# Patient Record
Sex: Female | Born: 1965 | Race: White | Hispanic: No | Marital: Single | Smoking: Never smoker
Health system: Southern US, Community
[De-identification: ages and names within clinical notes are randomized; demographics above are authoritative.]

## PROBLEM LIST (undated history)

## (undated) DIAGNOSIS — Z9889 Other specified postprocedural states: Secondary | ICD-10-CM

## (undated) DIAGNOSIS — Z87442 Personal history of urinary calculi: Secondary | ICD-10-CM

## (undated) DIAGNOSIS — T8859XA Other complications of anesthesia, initial encounter: Secondary | ICD-10-CM

## (undated) DIAGNOSIS — J302 Other seasonal allergic rhinitis: Secondary | ICD-10-CM

## (undated) DIAGNOSIS — N6320 Unspecified lump in the left breast, unspecified quadrant: Secondary | ICD-10-CM

## (undated) DIAGNOSIS — K869 Disease of pancreas, unspecified: Secondary | ICD-10-CM

## (undated) DIAGNOSIS — R112 Nausea with vomiting, unspecified: Secondary | ICD-10-CM

## (undated) HISTORY — DX: Other seasonal allergic rhinitis: J30.2

## (undated) HISTORY — PX: ANAL FISSURE REPAIR: SHX2312

## (undated) HISTORY — PX: WISDOM TOOTH EXTRACTION: SHX21

## (undated) HISTORY — PX: ABLATION ON ENDOMETRIOSIS: SHX5787

## (undated) HISTORY — PX: EYE SURGERY: SHX253

---

## 1998-12-01 ENCOUNTER — Ambulatory Visit (HOSPITAL_COMMUNITY): Admission: RE | Admit: 1998-12-01 | Discharge: 1998-12-01 | Payer: Self-pay | Admitting: General Surgery

## 2020-02-24 ENCOUNTER — Other Ambulatory Visit: Payer: Self-pay | Admitting: Obstetrics and Gynecology

## 2020-02-24 DIAGNOSIS — Z1231 Encounter for screening mammogram for malignant neoplasm of breast: Secondary | ICD-10-CM

## 2020-04-14 ENCOUNTER — Ambulatory Visit
Admission: RE | Admit: 2020-04-14 | Discharge: 2020-04-14 | Disposition: A | Discharge status: Home / Self Care | Payer: BC Managed Care – PPO | Source: Ambulatory Visit | Attending: Internal Medicine | Admitting: Internal Medicine

## 2020-04-14 ENCOUNTER — Other Ambulatory Visit: Payer: Self-pay

## 2020-04-14 ENCOUNTER — Other Ambulatory Visit: Payer: Self-pay | Admitting: Internal Medicine

## 2020-04-14 DIAGNOSIS — G44201 Tension-type headache, unspecified, intractable: Secondary | ICD-10-CM

## 2020-04-14 IMAGING — CT CT HEAD W/O CM
3 of 4 series · 14 of 47 positions shown, 16 images · non-contrast
Comparison: None.

CLINICAL DATA: Severe headaches since [DATE].  No known injury.

EXAM:
CT HEAD WITHOUT CONTRAST
TECHNIQUE: Contiguous axial images were obtained from the base of the skull
through the vertex without intravenous contrast.

[Series 2: axial st head 5.00 ax · axial · 0.31mm/px · z∈[-556,-441]mm · 8 of 27 slices shown, 10 images]
[im 2/27  brain]
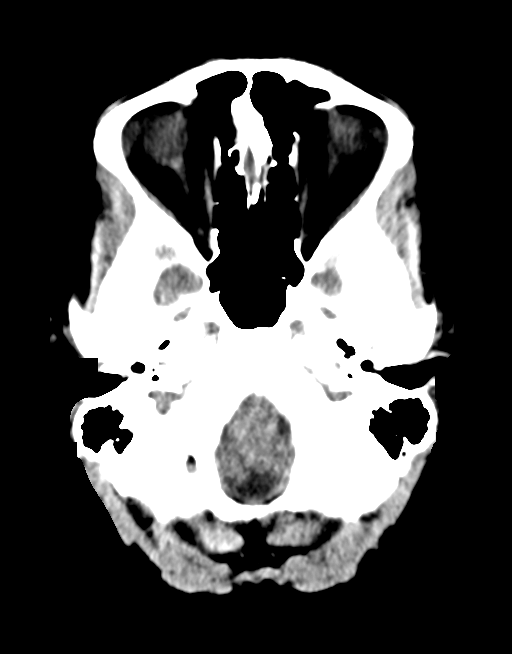
[im 2/27  bone]
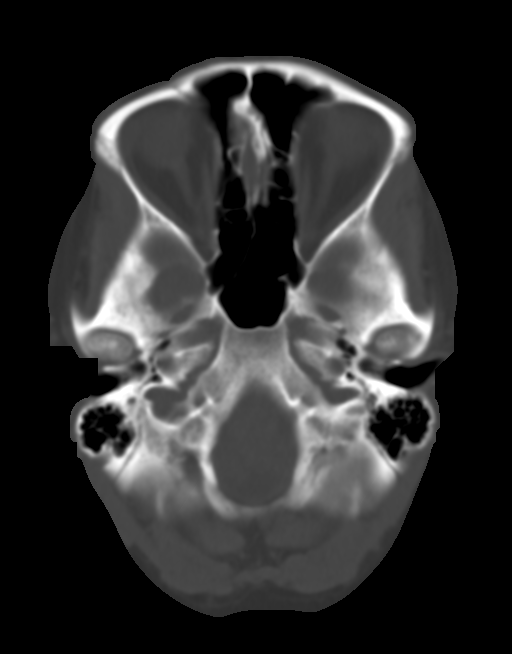
[im 6/27  brain]
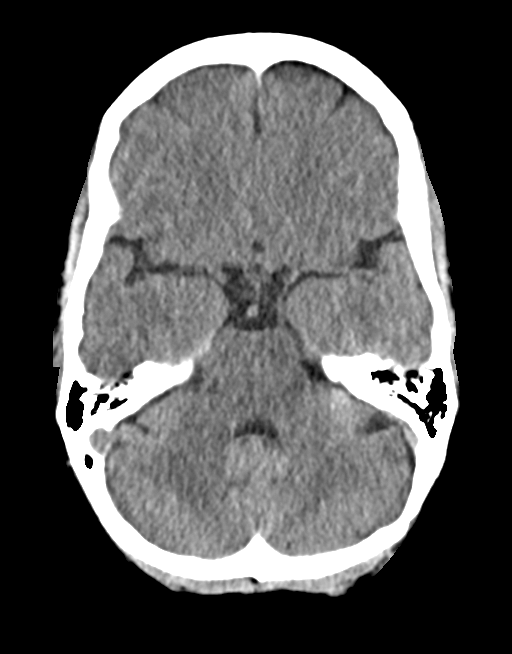
[im 10/27  brain]
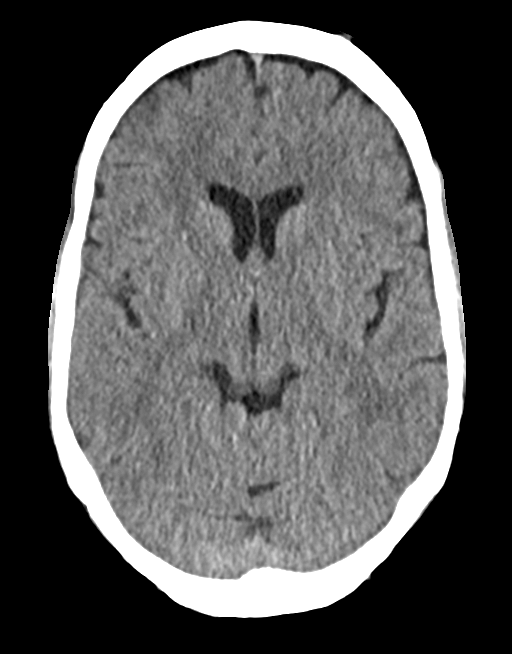
[im 12/27  brain]
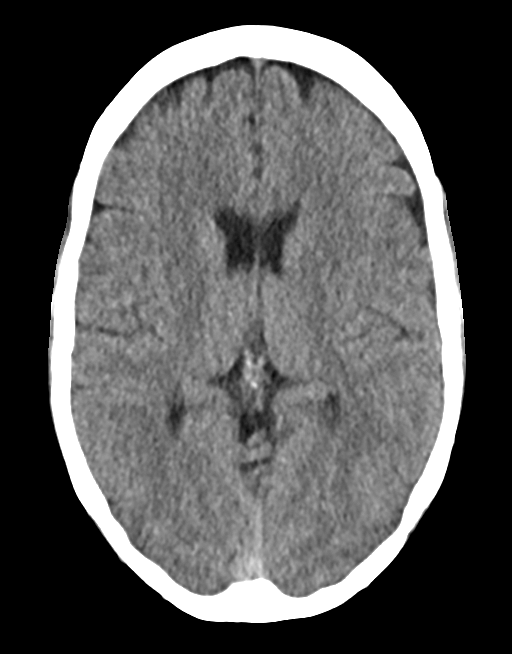
[im 15/27  brain]
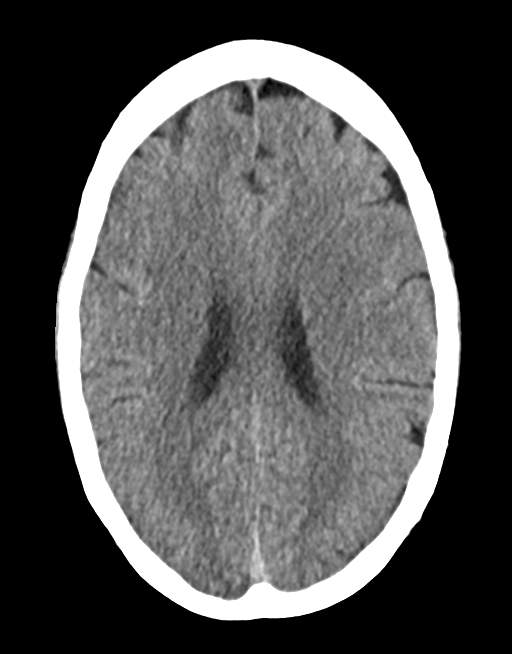
[im 15/27  bone]
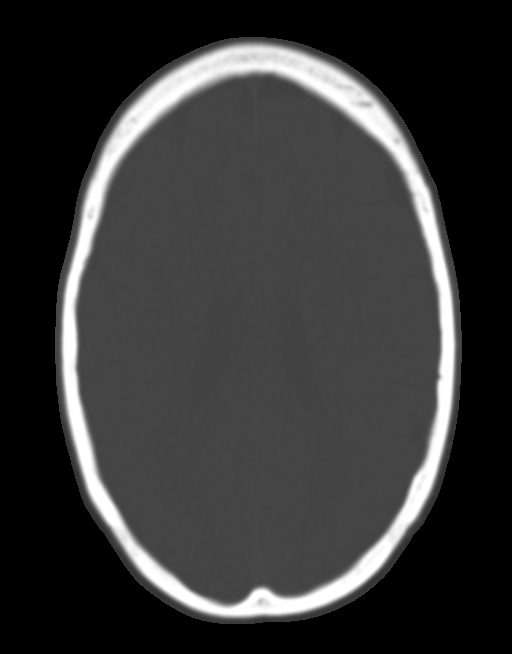
[im 17/27  brain]
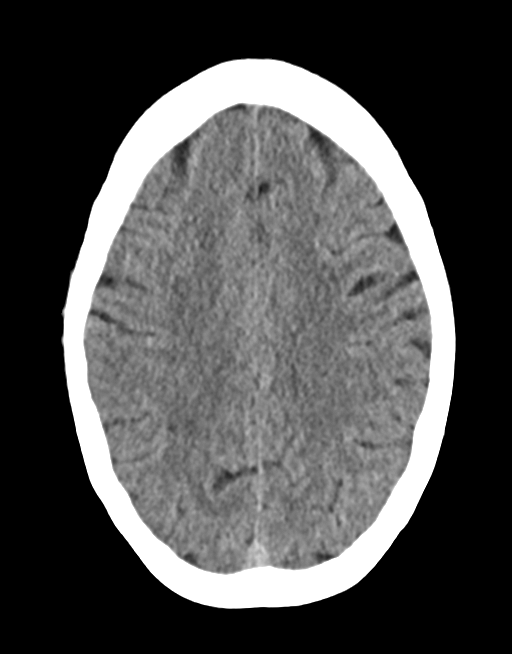
[im 21/27  brain]
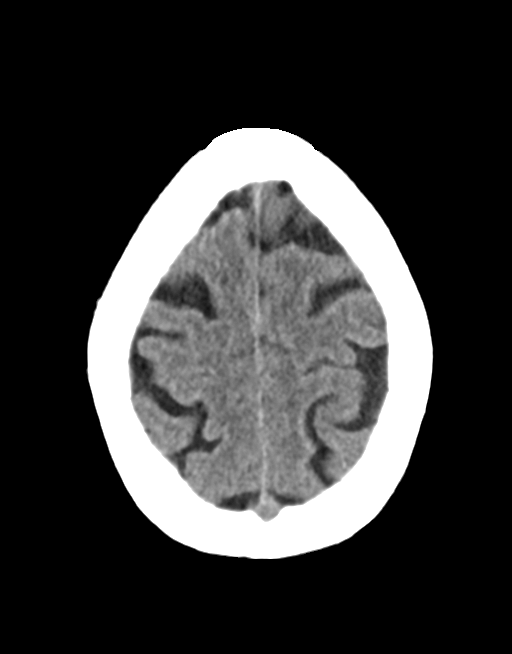
[im 25/27  brain]
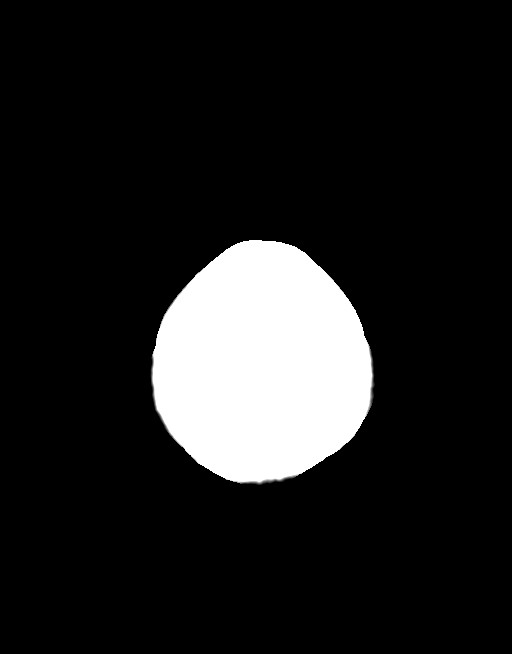

[Series 6: coronals head 3.00 cor · coronal · 0.27mm/px · 3 of 66 slices shown]
[im 22/66  brain]
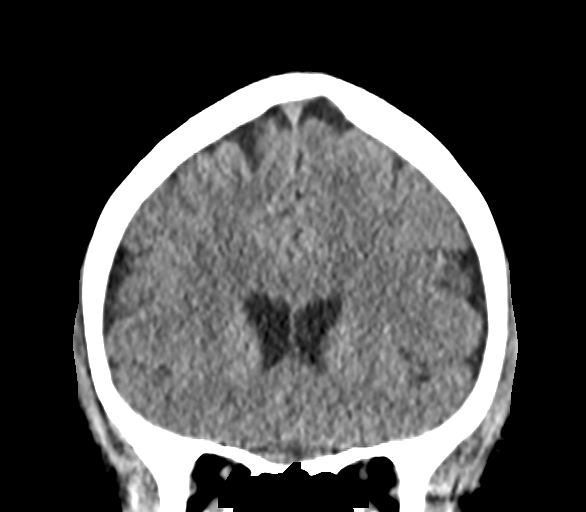
[im 29/66  brain]
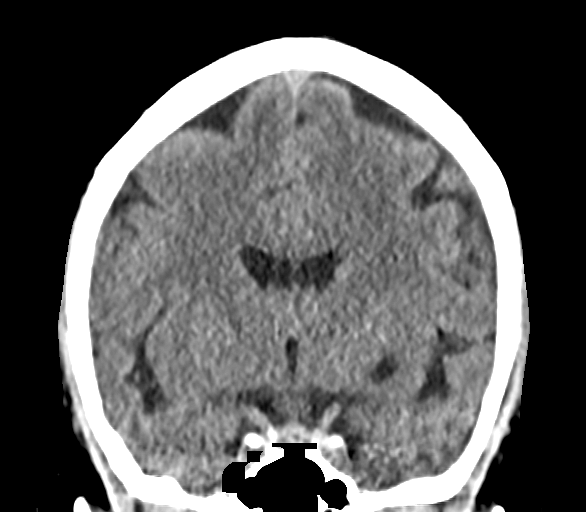
[im 37/66  brain]
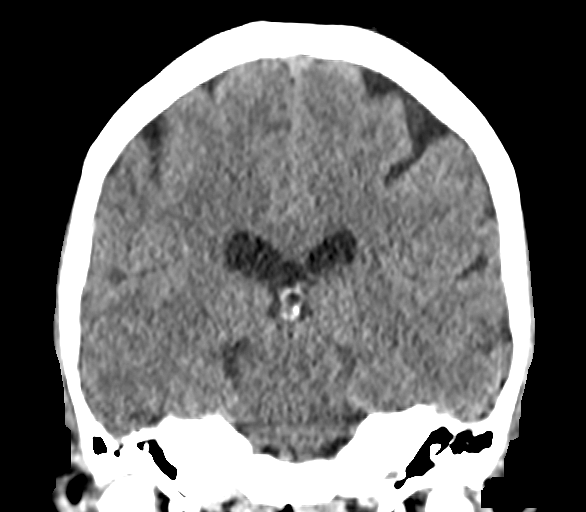

[Series 8: sagittals head 3.00 sag · sagittal · 0.27mm/px · 3 of 52 slices shown]
[im 18/52  brain]
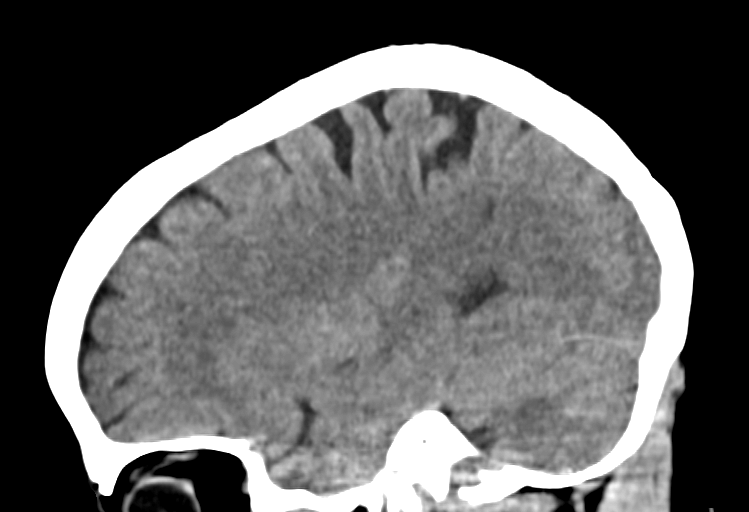
[im 26/52  brain]
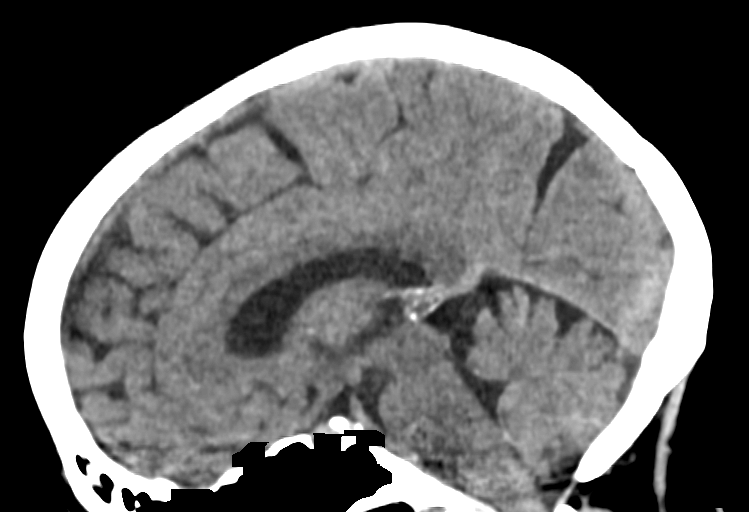
[im 35/52  brain]
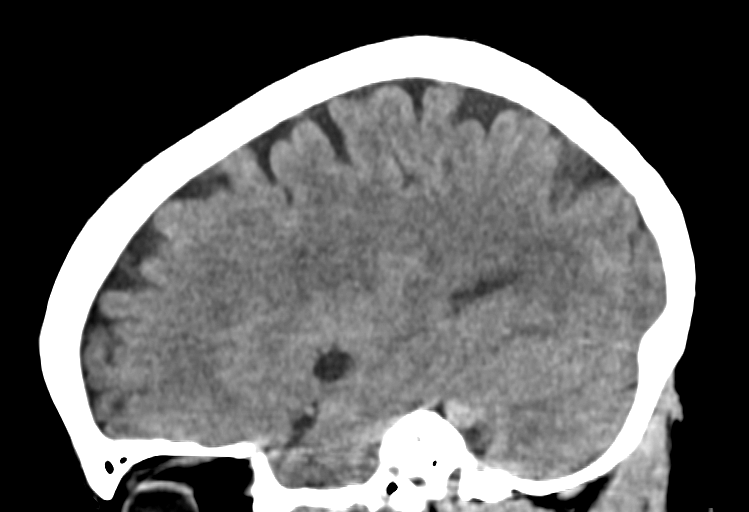

[14 of 47 positions shown; findings below may reference images not displayed]

FINDINGS: Brain: No evidence of acute infarction, hemorrhage, hydrocephalus,
extra-axial collection or mass lesion/mass effect. Dilated
perivascular space near the left basal ganglia noted.

Vascular: No hyperdense vessel or unexpected calcification.

Skull: Intact.  No focal lesion.

Sinuses/Orbits: Normal.

Other: None.
IMPRESSION: Normal head CT.

## 2020-04-20 ENCOUNTER — Other Ambulatory Visit (HOSPITAL_COMMUNITY): Payer: Self-pay | Admitting: Internal Medicine

## 2020-04-20 ENCOUNTER — Other Ambulatory Visit: Payer: Self-pay | Admitting: Internal Medicine

## 2020-04-20 DIAGNOSIS — R109 Unspecified abdominal pain: Secondary | ICD-10-CM

## 2020-05-04 ENCOUNTER — Other Ambulatory Visit: Payer: Self-pay

## 2020-05-04 ENCOUNTER — Ambulatory Visit
Admission: RE | Admit: 2020-05-04 | Discharge: 2020-05-04 | Disposition: A | Discharge status: Home / Self Care | Payer: BC Managed Care – PPO | Source: Ambulatory Visit | Attending: Internal Medicine | Admitting: Internal Medicine

## 2020-05-04 DIAGNOSIS — R109 Unspecified abdominal pain: Secondary | ICD-10-CM | POA: Insufficient documentation

## 2020-05-04 IMAGING — CT CT ABD-PELV W/ CM
2 of 5 series · 16 of 46 positions shown, 18 images · IV contrast (omnipaque)
Comparison: None.

CLINICAL DATA: Patient reports right flank pain for 3 weeks.

EXAM:
CT ABDOMEN AND PELVIS WITH CONTRAST
TECHNIQUE: Multidetector CT imaging of the abdomen and pelvis was performed
using the standard protocol following bolus administration of
intravenous contrast.
CONTRAST:  100mL OMNIPAQUE IOHEXOL 300 MG/ML  SOLN

[Series 2: abd pelvis 5.00 · axial · 0.80mm/px · z∈[-1480,-1090]mm · 13 of 88 slices shown, 15 images]
[im 5/88  soft-tissue]
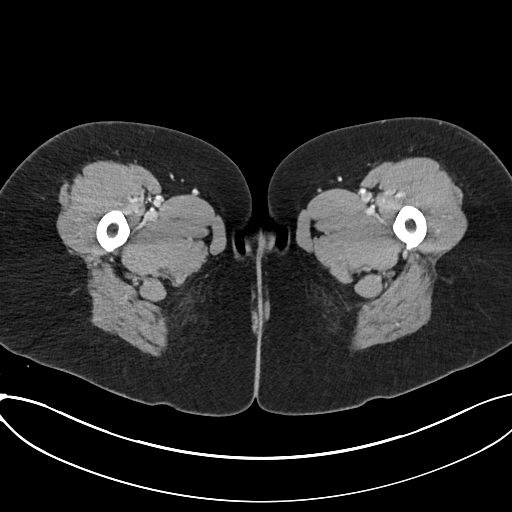
[im 5/88  bone]
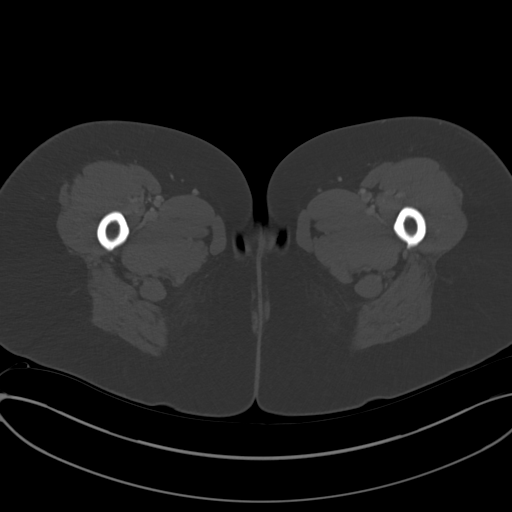
[im 14/88  soft-tissue]
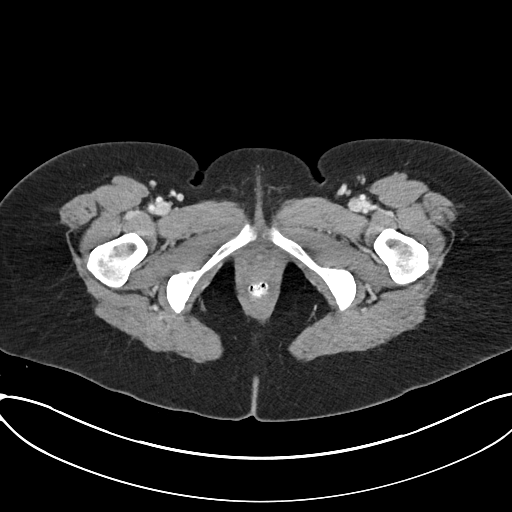
[im 18/88  soft-tissue]
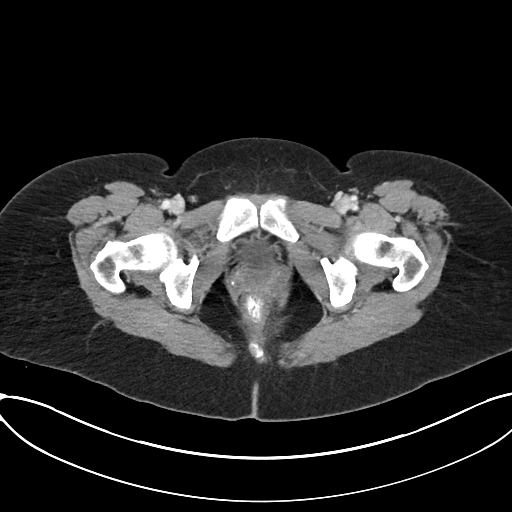
[im 27/88  soft-tissue]
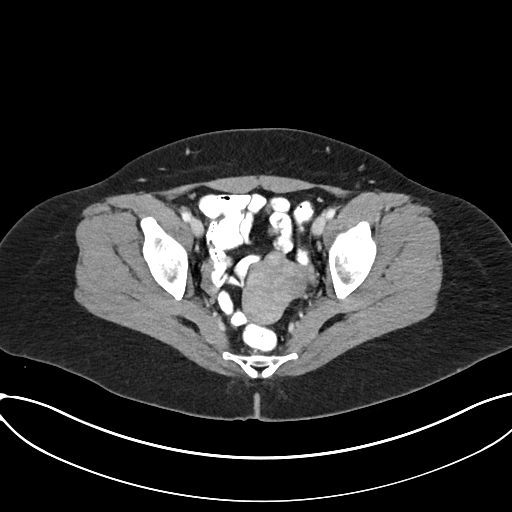
[im 31/88  soft-tissue]
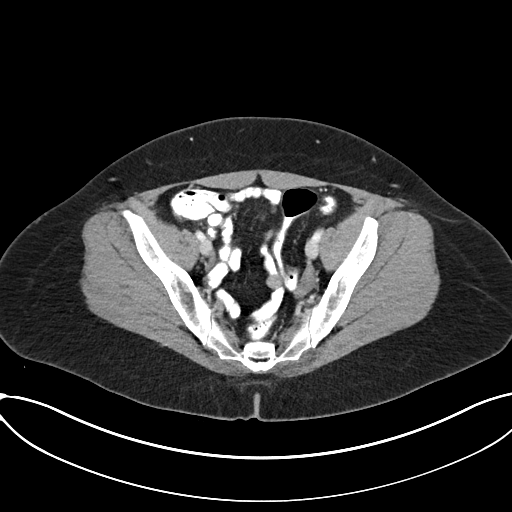
[im 40/88  soft-tissue]
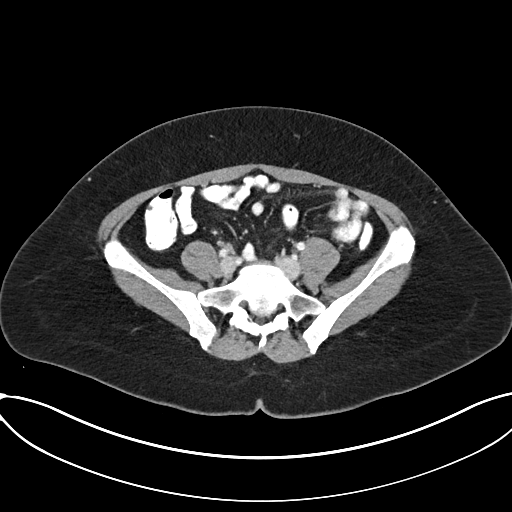
[im 44/88  soft-tissue]
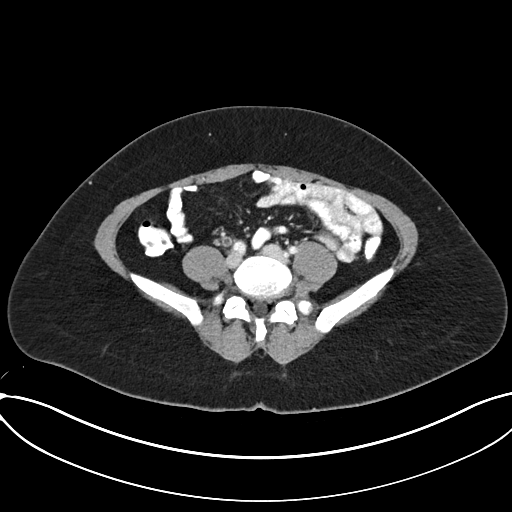
[im 48/88  soft-tissue]
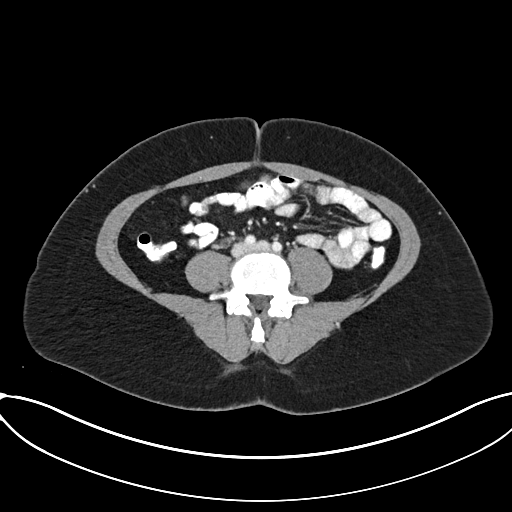
[im 57/88  soft-tissue]
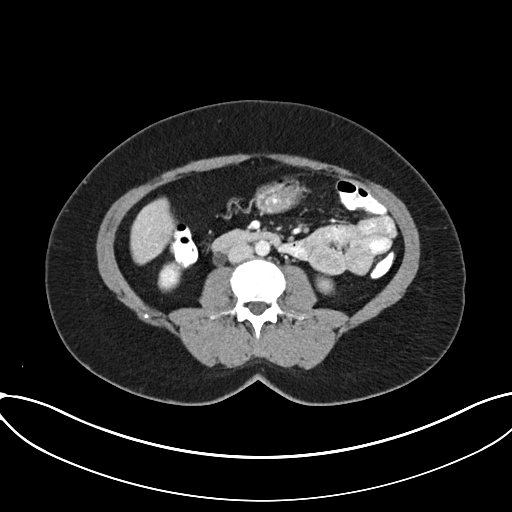
[im 57/88  bone]
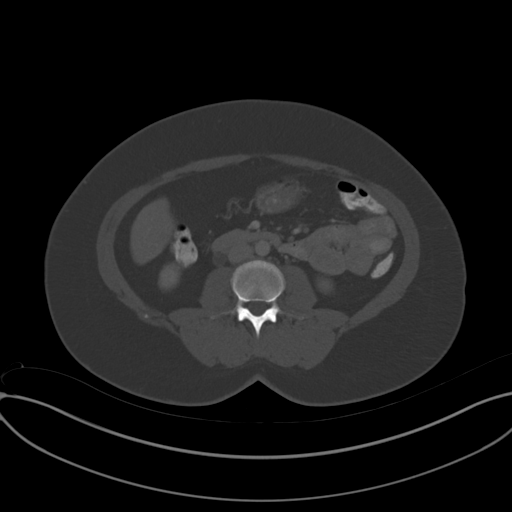
[im 61/88  soft-tissue]
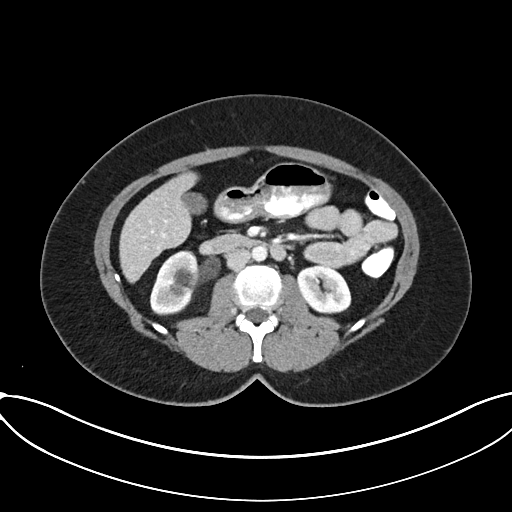
[im 70/88  soft-tissue]
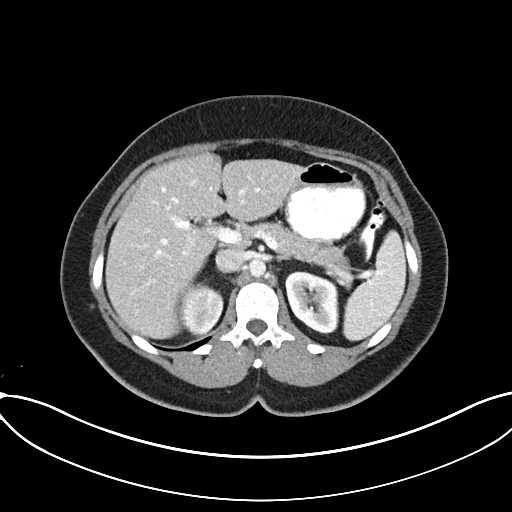
[im 74/88  soft-tissue]
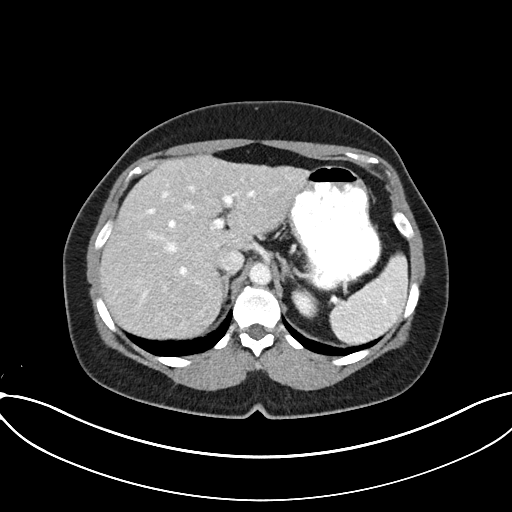
[im 83/88  soft-tissue]
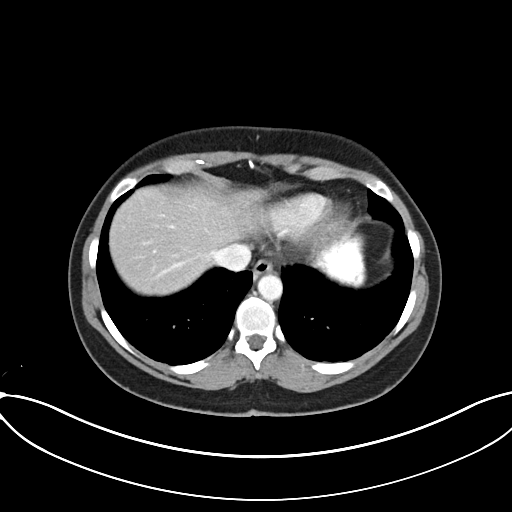

[Series 4: coronals abd pelvis 2.00 cor · coronal · 0.80mm/px · 3 of 133 slices shown]
[im 45/133  soft-tissue]
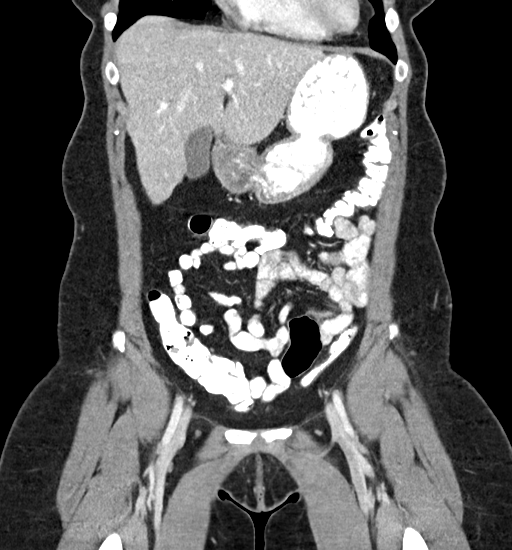
[im 59/133  soft-tissue]
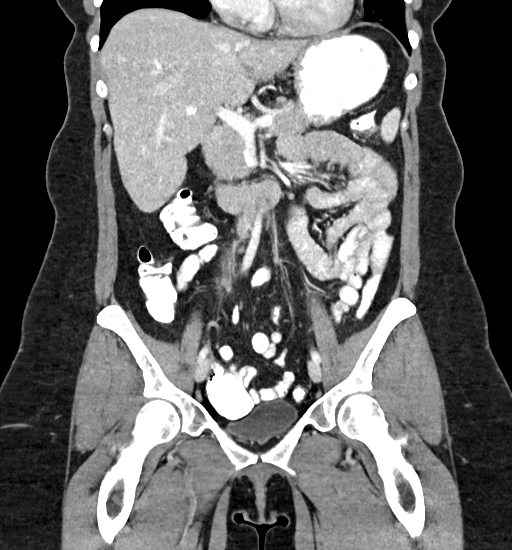
[im 74/133  soft-tissue]
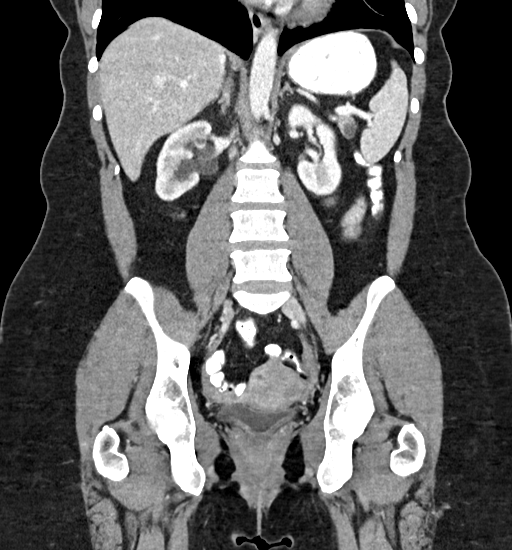

[16 of 46 positions shown; findings below may reference images not displayed]

FINDINGS: Lower chest: The lung bases are clear. No focal airspace disease or
pleural fluid.

Hepatobiliary: No focal liver abnormality is seen. No gallstones,
gallbladder wall thickening, or biliary dilatation.

Pancreas: There is a hypodense lesion within the distal pancreatic
tail that measures 10 mm. This is slightly higher density than
simple fluid. No pancreatic ductal dilatation or surrounding
inflammatory changes.

Spleen: Normal in size without focal abnormality.

Adrenals/Urinary Tract: Normal adrenal glands. Obstructing 8 x 7 mm
stone in the mid right ureter (at the level of S1) with mild
proximal hydroureteronephrosis. There is minimal right perinephric
edema. Diminished excretion from the right kidney on delayed phase
imaging. The ureter distal to the stone is decompressed. No evidence
of additional renal calculi. No left hydronephrosis. No focal renal
lesion. Partially distended urinary bladder which is unremarkable.

Stomach/Bowel: Stomach is within normal limits. Normal positioning
of the duodenum and ligament of Treitz. Appendix appears normal. No
evidence of bowel wall thickening, distention, or inflammatory
changes.

Vascular/Lymphatic: Normal caliber abdominal aorta. Patent portal
vein. No acute vascular findings. No enlarged lymph nodes in the
abdomen or pelvis.

Reproductive: Heterogeneous uterus with fibroids, including a 3.6 cm
right fundal fibroid. Left ovary is normal and quiescent. Right
ovary is not definitively seen. There is no suspicious adnexal mass.

Other: No ascites or focal fluid collection.

Musculoskeletal: There are no acute or suspicious osseous
abnormalities.
IMPRESSION: 1. Obstructing 8 x 7 mm stone in the mid right ureter with mild
hydronephrosis.
2. Small 10 mm hypodense lesion in the distal pancreatic tail is
nonspecific. This may represent a cyst, however recommend further
characterization with pancreatic protocol MRI on an elective basis.
3. Uterine fibroids.

These results will be called to the ordering clinician or
representative by the Radiologist Assistant, and communication
documented in the PACS or [REDACTED].

## 2020-05-04 MED ORDER — IOHEXOL 300 MG/ML  SOLN
100.0000 mL | Freq: Once | INTRAMUSCULAR | Status: AC | PRN
  Administered 2020-05-04: 100 mL via INTRAVENOUS

## 2020-05-06 ENCOUNTER — Ambulatory Visit
Admission: RE | Admit: 2020-05-06 | Discharge: 2020-05-06 | Disposition: A | Discharge status: Home / Self Care | Payer: BC Managed Care – PPO | Source: Ambulatory Visit | Attending: Obstetrics and Gynecology | Admitting: Obstetrics and Gynecology

## 2020-05-06 ENCOUNTER — Other Ambulatory Visit: Payer: Self-pay | Admitting: Internal Medicine

## 2020-05-06 ENCOUNTER — Other Ambulatory Visit: Payer: Self-pay

## 2020-05-06 DIAGNOSIS — N132 Hydronephrosis with renal and ureteral calculous obstruction: Secondary | ICD-10-CM

## 2020-05-06 DIAGNOSIS — K869 Disease of pancreas, unspecified: Secondary | ICD-10-CM

## 2020-05-06 DIAGNOSIS — Z1231 Encounter for screening mammogram for malignant neoplasm of breast: Secondary | ICD-10-CM | POA: Diagnosis present

## 2020-05-06 IMAGING — MG MM DIGITAL SCREENING BILAT W/ TOMO AND CAD
6 of 10 series · 6 of 30 positions shown · non-contrast
Comparison: None.

CLINICAL DATA: Screening.

EXAM:
DIGITAL SCREENING BILATERAL MAMMOGRAM WITH TOMOSYNTHESIS AND CAD
TECHNIQUE: Bilateral screening digital craniocaudal and mediolateral oblique
mammograms were obtained. Bilateral screening digital breast
tomosynthesis was performed. The images were evaluated with
computer-aided detection.

[R CC synth-2D]
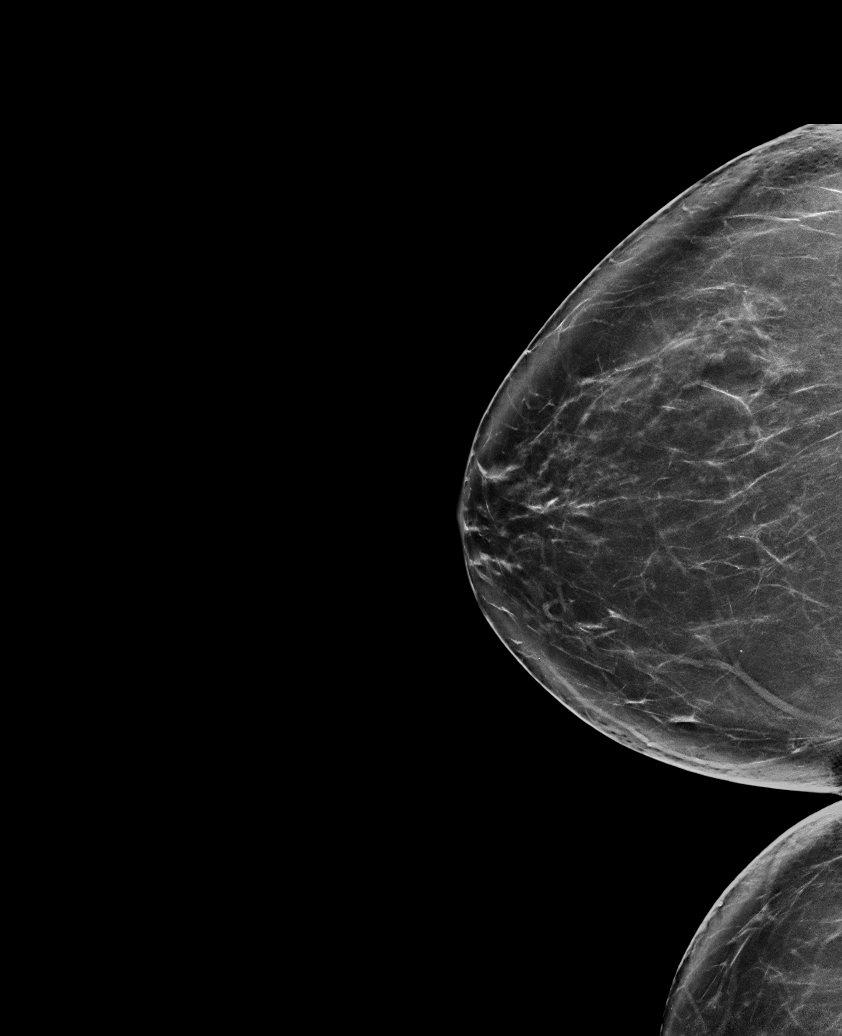

[L MLO synth-2D (1 of 2)]
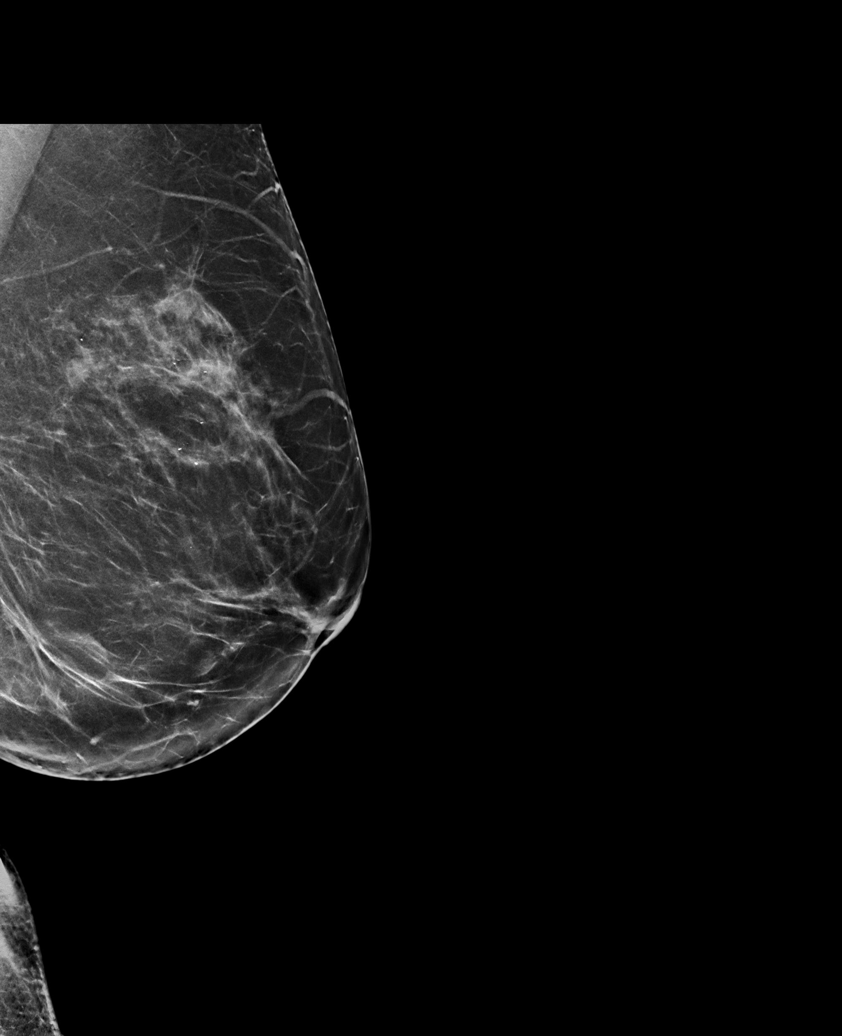

[L CC synth-2D]
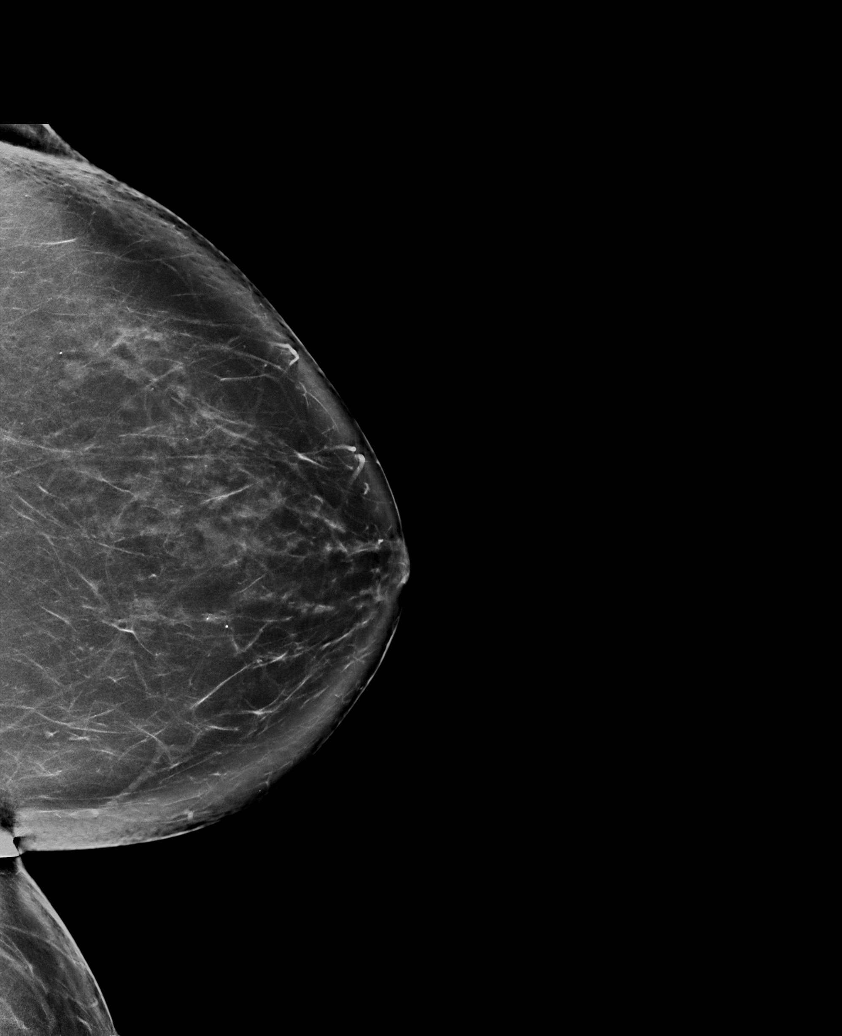

[R MLO synth-2D]
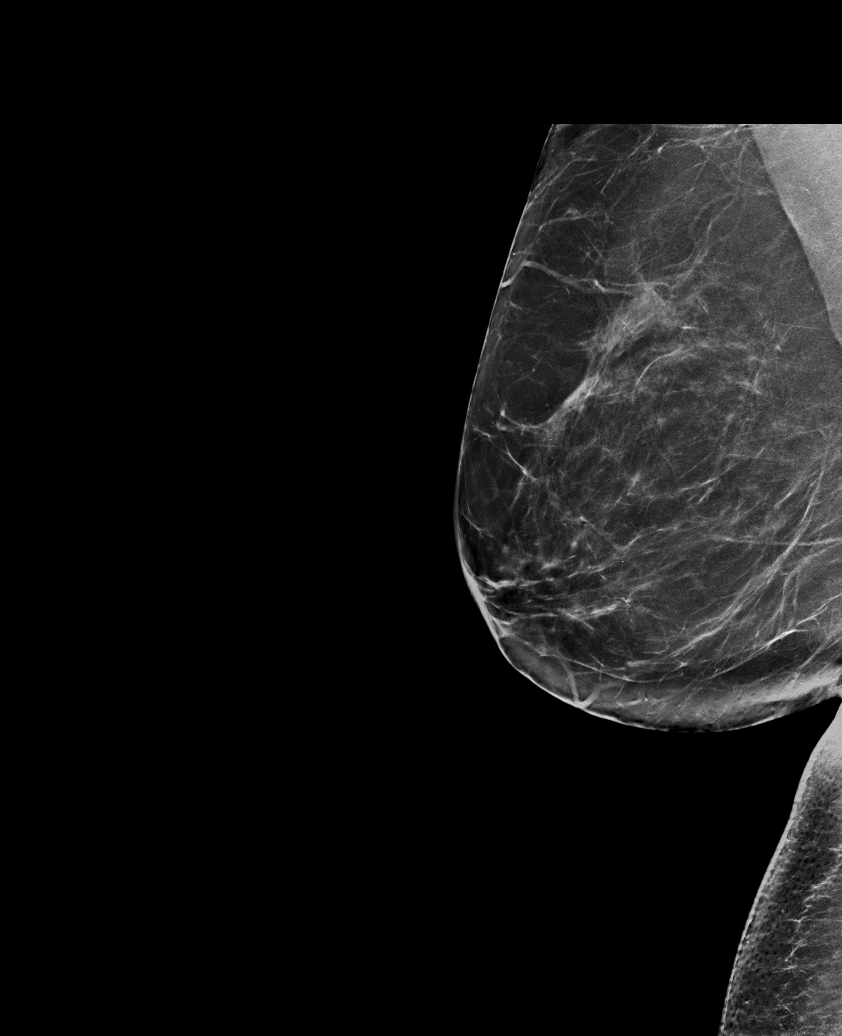

[L MLO synth-2D (2 of 2)]
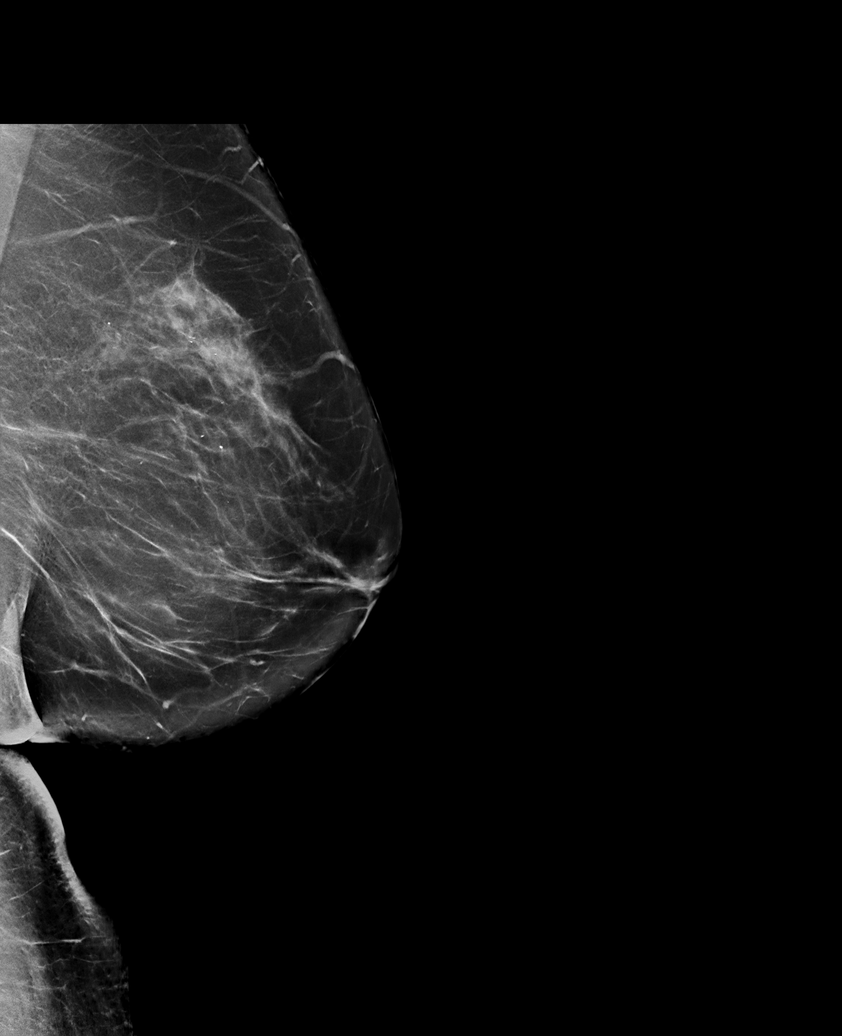

[L MLO tomo · tomo slice 49/96.0]
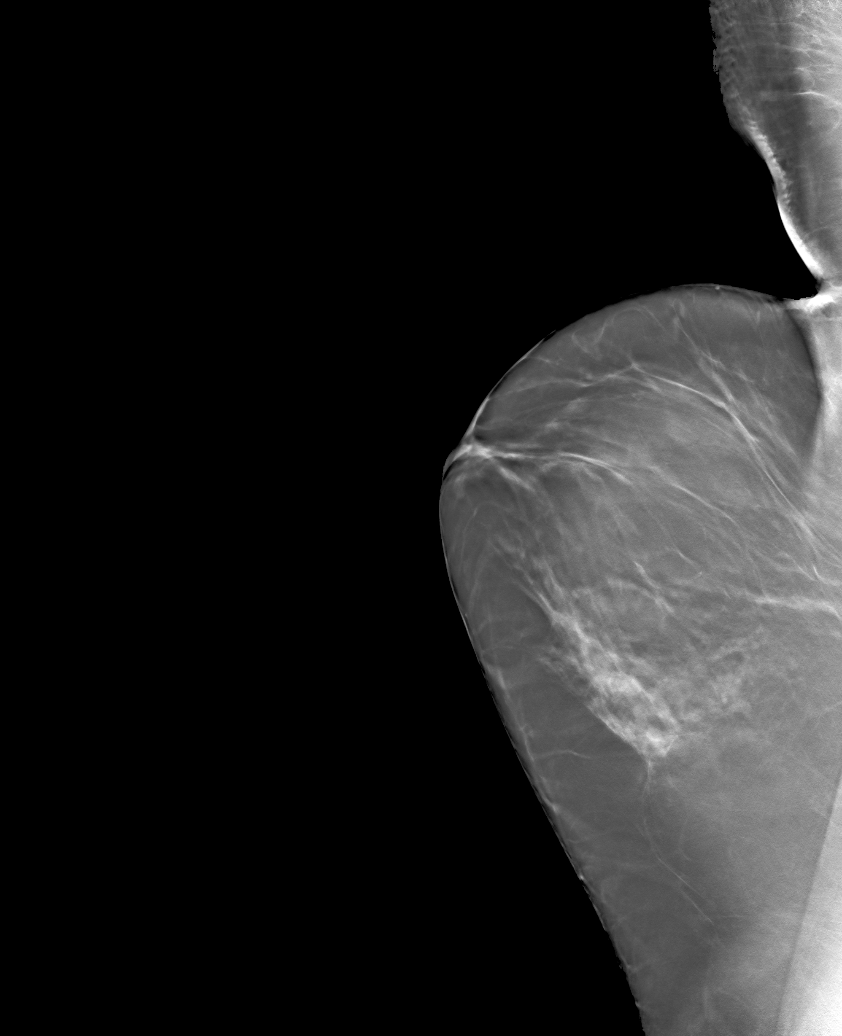

[6 of 30 positions shown; findings below may reference images not displayed]

ACR Breast Density Category b: There are scattered areas of
fibroglandular density.
FINDINGS: In the left breast, a possible mass warrants further evaluation. In
the right breast, no findings suspicious for malignancy.

The images were evaluated with computer-aided detection.
IMPRESSION: Further evaluation is suggested for a possible mass in the left
breast.

RECOMMENDATION:
Diagnostic mammogram and possibly ultrasound of the left breast.
(Code:[M9])

The patient will be contacted regarding the findings, and additional
imaging will be scheduled.

BI-RADS CATEGORY  0: Incomplete. Need additional imaging evaluation
and/or prior mammograms for comparison.

## 2020-05-10 ENCOUNTER — Other Ambulatory Visit: Payer: Self-pay

## 2020-05-10 ENCOUNTER — Ambulatory Visit: Payer: BC Managed Care – PPO | Admitting: Urology

## 2020-05-10 VITALS — BP 127/89 | HR 103 | Temp 98.2°F | Ht 60.0 in | Wt 155.0 lb

## 2020-05-10 DIAGNOSIS — N3000 Acute cystitis without hematuria: Secondary | ICD-10-CM | POA: Diagnosis not present

## 2020-05-10 DIAGNOSIS — N201 Calculus of ureter: Secondary | ICD-10-CM

## 2020-05-10 DIAGNOSIS — N1330 Unspecified hydronephrosis: Secondary | ICD-10-CM | POA: Diagnosis not present

## 2020-05-10 MED ORDER — CEPHALEXIN 500 MG PO CAPS: 500.0000 mg | ORAL_CAPSULE | Freq: Three times a day (TID) | ORAL | 0 refills | Status: AC

## 2020-05-10 NOTE — Progress Notes (Unsigned)
05/10/2020 8:58 AM   Carolyn Trevino [facility] 12-04-1965 [id number]  Referring provider: Enid Baas, MD [address] [place],  [place] [address]  No chief complaint on file.   HPI: 55 year old female who presents today for further evaluation of right mid ureteral calculus with obstruction.  She has been experiencing right flank pain, nausea, low-grade temps, and headaches for the past month or so.  These have failed to improve.  About a month ago, she had a acute onset of severe sleeping which lasted several days and the pain improved significantly for several weeks.  Just prior to her CT scan on 05/04/2020, she developed recurrent pain which is persisted.  No personal history of kidney stones.  CT abdomen pelvis shows an 8 mm right mid ureteral calculus with mild hydroureteronephrosis.  She has no additional upper tract stones.  Stone to skin distance 13 cm.  Stone density approximately 1300 Hounsfield units.  As stated above, she had some low-grade temps but without dysuria or gross hematuria.  Urine today is mildly suspicious.  She has been using ibuprofen/Motrin for headaches.  Most recent dose was this a.m.   PMH: History reviewed. No pertinent past medical history.  Surgical History: History reviewed. No pertinent surgical history.  Home Medications:  Allergies as of 05/10/2020      Reactions   Anesthetics, Amide Nausea And Vomiting   Hydrocodone Nausea Only   Propofol Nausea Only   Wasp Venom Swelling   Wasp Venom Protein    Sulfamethoxazole-trimethoprim Rash      Medication List       Accurate as of May 10, 2020 11:59 PM. If you have any questions, ask your nurse or doctor.        STOP taking these medications   amoxicillin-clavulanate 875-125 MG tablet Commonly known as: AUGMENTIN Stopped by: [name], MD     TAKE these medications   butalbital-acetaminophen-caffeine 50-325-40 MG tablet Commonly known as: FIORICET Take 1 tablet by  mouth every 6 (six) hours as needed.   cephALEXin 500 MG capsule Commonly known as: Keflex Take 1 capsule (500 mg total) by mouth 3 (three) times daily for 7 days. Started by: [name], MD   meloxicam 15 MG tablet Commonly known as: MOBIC Take 15 mg by mouth daily.   tiZANidine 4 MG tablet Commonly known as: ZANAFLEX tizanidine 4 mg tablet   Vitamin C Gummies 125 MG Chew Generic drug: Ascorbic Acid       Allergies:  Allergies  Allergen Reactions  . Anesthetics, Amide Nausea And Vomiting  . Hydrocodone Nausea Only  . Propofol Nausea Only  . Wasp Venom Swelling  . Wasp Venom Protein   . Sulfamethoxazole-Trimethoprim Rash    Family History: Family History  Problem Relation Age of Onset  . Breast cancer Mother 37    Social History:  has no history on file for tobacco use, alcohol use, and drug use.   Physical Exam: BP 127/89   Pulse (!) 103   Temp 98.2 F (36.8 C)   Ht 5' (1.524 m)   Wt 155 lb (70.3 kg)   BMI 30.27 kg/m   Constitutional:  Alert and oriented, No acute distress. HEENT: [place] AT, moist mucus membranes.  Trachea midline, no masses. Cardiovascular: No clubbing, cyanosis, or edema. Respiratory: Normal respiratory effort, no increased work of breathing. GI: Abdomen is soft, nontender, nondistended, no abdominal masses GU: No CVA tenderness. Skin: No rashes, bruises or suspicious lesions. Neurologic: Grossly intact, no focal deficits, moving all  4 extremities. Psychiatric: Normal mood and affect.  Laboratory Data: Results for orders placed or performed in visit on 05/10/20  Microscopic Examination   Urine  Result Value Ref Range   WBC, UA >30 (A) 0 - 5 /hpf   RBC 3-10 (A) 0 - 2 /hpf   Epithelial Cells (non renal) 0-10 0 - 10 /hpf   Bacteria, UA Moderate (A) None seen/Few  Urinalysis, Complete  Result Value Ref Range   Specific Gravity, UA 1.020 1.005 - 1.030   pH, UA 5.5 5.0 - 7.5   Color, UA Yellow Yellow   Appearance Ur Cloudy (A)  Clear   Leukocytes,UA 2+ (A) Negative   Protein,UA 2+ (A) Negative/Trace   Glucose, UA Negative Negative   Ketones, UA Trace (A) Negative   RBC, UA 1+ (A) Negative   Bilirubin, UA Negative Negative   Urobilinogen, Ur 0.2 0.2 - 1.0 mg/dL   Nitrite, UA Negative Negative   Microscopic Examination See below:      Pertinent Imaging:   Assessment & Plan:    1. Right ureteral stone Very large 8 mm obstructing ureteral calculus on the right which been present now for nearly 30 days  Based on the size and location of the stone and its chronicity, its unlikely to pass spontaneously this point in time.  If strongly recommended intervention.  We discussed various treatment options for urolithiasis including observation with or without medical expulsive therapy, shockwave lithotripsy (SWL), ureteroscopy and laser lithotripsy with stent placement, and percutaneous nephrolithotomy.   We discussed that management is based on stone size, location, density, patient co-morbidities, and patient preference.    SWL has a lower stone free rate in a single procedure, but also a lower complication rate compared to ureteroscopy and avoids a stent and associated stent related symptoms. Possible complications include renal hematoma, steinstrasse, and need for additional treatment. We discussed the role of his increased skin to stone distance can lead to decreased efficacy with shockwave lithotripsy.   Ureteroscopy with laser lithotripsy and stent placement has a higher stone free rate than SWL in a single procedure, however increased complication rate including possible infection, ureteral injury, bleeding, and stent related morbidity. Common stent related symptoms include dysuria, urgency/frequency, and flank pain.   After an extensive discussion of the risks and benefits of the above treatment options, the patient would like to proceed with ESWL.  She is a candidate for this despite her NSAIDs given that is  outside the renal shadow.  She understands that her stone is somewhat more dense, 1300 Hounsfield units and typically ideal for shockwave but she like to try a lesser invasive procedure first.  She understand that she may need a staged procedure if this fails.  - Urinalysis, Complete - CULTURE, URINE COMPREHENSIVE  2. Hydronephrosis, right As above secondary #1  3. Acute cystitis without hematuria Urinalysis is suspicious for possible infection, will treat with Keflex for presumed infection especially in the setting of upcoming procedure.  Follow-up urine culture.     [name], MD  [facility] [address] [place], [place] [address] [phone]

## 2020-05-10 NOTE — H&P (View-Only) (Signed)
 05/10/2020 8:58 AM   Carolyn Trevino 04/30/1965 [id number]  Referring provider: Kalisetti, Radhika, MD [address] [place] [address]  No chief complaint on file.   HPI: 55-year-old female who presents today for further evaluation of right mid ureteral calculus with obstruction.  She has been experiencing right flank pain, nausea, low-grade temps, and headaches for the past month or so.  These have failed to improve.  About a month ago, she had a acute onset of severe sleeping which lasted several days and the pain improved significantly for several weeks.  Just prior to her CT scan on 05/04/2020, she developed recurrent pain which is persisted.  No personal history of kidney stones.  CT abdomen pelvis shows an 8 mm right mid ureteral calculus with mild hydroureteronephrosis.  She has no additional upper tract stones.  Stone to skin distance 13 cm.  Stone density approximately 1300 Hounsfield units.  As stated above, she had some low-grade temps but without dysuria or gross hematuria.  Urine today is mildly suspicious.  She has been using ibuprofen/Motrin for headaches.  Most recent dose was this a.m.   PMH: History reviewed. No pertinent past medical history.  Surgical History: History reviewed. No pertinent surgical history.  Home Medications:  Allergies as of 05/10/2020      Reactions   Anesthetics, Amide Nausea And Vomiting   Hydrocodone Nausea Only   Propofol Nausea Only   Wasp Venom Swelling   Wasp Venom Protein    Sulfamethoxazole-trimethoprim Rash      Medication List       Accurate as of May 10, 2020 11:59 PM. If you have any questions, ask your nurse or doctor.        STOP taking these medications   amoxicillin-clavulanate 875-125 MG tablet Commonly known as: AUGMENTIN Stopped by: Hamsini Verrilli, MD     TAKE these medications   butalbital-acetaminophen-caffeine 50-325-40 MG tablet Commonly known as: FIORICET Take 1 tablet by  mouth every 6 (six) hours as needed.   cephALEXin 500 MG capsule Commonly known as: Keflex Take 1 capsule (500 mg total) by mouth 3 (three) times daily for 7 days. Started by: Alek Poncedeleon, MD   meloxicam 15 MG tablet Commonly known as: MOBIC Take 15 mg by mouth daily.   tiZANidine 4 MG tablet Commonly known as: ZANAFLEX tizanidine 4 mg tablet   Vitamin C Gummies 125 MG Chew Generic drug: Ascorbic Acid       Allergies:  Allergies  Allergen Reactions  . Anesthetics, Amide Nausea And Vomiting  . Hydrocodone Nausea Only  . Propofol Nausea Only  . Wasp Venom Swelling  . Wasp Venom Protein   . Sulfamethoxazole-Trimethoprim Rash    Family History: Family History  Problem Relation Age of Onset  . Breast cancer Mother 71    Social History:  has no history on file for tobacco use, alcohol use, and drug use.   Physical Exam: BP 127/89   Pulse (!) 103   Temp 98.2 F (36.8 C)   Ht 5' (1.524 m)   Wt 155 lb (70.3 kg)   BMI 30.27 kg/m   Constitutional:  Alert and oriented, No acute distress. HEENT: [place] AT, moist mucus membranes.  Trachea midline, no masses. Cardiovascular: No clubbing, cyanosis, or edema. Respiratory: Normal respiratory effort, no increased work of breathing. GI: Abdomen is soft, nontender, nondistended, no abdominal masses GU: No CVA tenderness. Skin: No rashes, bruises or suspicious lesions. Neurologic: Grossly intact, no focal deficits, moving all   4 extremities. Psychiatric: Normal mood and affect.  Laboratory Data: Results for orders placed or performed in visit on 05/10/20  Microscopic Examination   Urine  Result Value Ref Range   WBC, UA >30 (A) 0 - 5 /hpf   RBC 3-10 (A) 0 - 2 /hpf   Epithelial Cells (non renal) 0-10 0 - 10 /hpf   Bacteria, UA Moderate (A) None seen/Few  Urinalysis, Complete  Result Value Ref Range   Specific Gravity, UA 1.020 1.005 - 1.030   pH, UA 5.5 5.0 - 7.5   Color, UA Yellow Yellow   Appearance Ur Cloudy (A)  Clear   Leukocytes,UA 2+ (A) Negative   Protein,UA 2+ (A) Negative/Trace   Glucose, UA Negative Negative   Ketones, UA Trace (A) Negative   RBC, UA 1+ (A) Negative   Bilirubin, UA Negative Negative   Urobilinogen, Ur 0.2 0.2 - 1.0 mg/dL   Nitrite, UA Negative Negative   Microscopic Examination See below:      Pertinent Imaging:   Assessment & Plan:    1. Right ureteral stone Very large 8 mm obstructing ureteral calculus on the right which been present now for nearly 30 days  Based on the size and location of the stone and its chronicity, its unlikely to pass spontaneously this point in time.  If strongly recommended intervention.  We discussed various treatment options for urolithiasis including observation with or without medical expulsive therapy, shockwave lithotripsy (SWL), ureteroscopy and laser lithotripsy with stent placement, and percutaneous nephrolithotomy.   We discussed that management is based on stone size, location, density, patient co-morbidities, and patient preference.    SWL has a lower stone free rate in a single procedure, but also a lower complication rate compared to ureteroscopy and avoids a stent and associated stent related symptoms. Possible complications include renal hematoma, steinstrasse, and need for additional treatment. We discussed the role of his increased skin to stone distance can lead to decreased efficacy with shockwave lithotripsy.   Ureteroscopy with laser lithotripsy and stent placement has a higher stone free rate than SWL in a single procedure, however increased complication rate including possible infection, ureteral injury, bleeding, and stent related morbidity. Common stent related symptoms include dysuria, urgency/frequency, and flank pain.   After an extensive discussion of the risks and benefits of the above treatment options, the patient would like to proceed with ESWL.  She is a candidate for this despite her NSAIDs given that is  outside the renal shadow.  She understands that her stone is somewhat more dense, 1300 Hounsfield units and typically ideal for shockwave but she like to try a lesser invasive procedure first.  She understand that she may need a staged procedure if this fails.  - Urinalysis, Complete - CULTURE, URINE COMPREHENSIVE  2. Hydronephrosis, right As above secondary #1  3. Acute cystitis without hematuria Urinalysis is suspicious for possible infection, will treat with Keflex for presumed infection especially in the setting of upcoming procedure.  Follow-up urine culture.     [name], MD  [facility] [address] [place], [place] [address] [phone]

## 2020-05-11 ENCOUNTER — Other Ambulatory Visit: Payer: Self-pay | Admitting: Radiology

## 2020-05-11 ENCOUNTER — Encounter: Payer: Self-pay | Admitting: Urology

## 2020-05-11 DIAGNOSIS — N201 Calculus of ureter: Secondary | ICD-10-CM

## 2020-05-11 LAB — URINALYSIS, COMPLETE
Bilirubin, UA: NEGATIVE
Glucose, UA: NEGATIVE
Nitrite, UA: NEGATIVE
Specific Gravity, UA: 1.02 (ref 1.005–1.030)
Urobilinogen, Ur: 0.2 mg/dL (ref 0.2–1.0)
pH, UA: 5.5 (ref 5.0–7.5)

## 2020-05-11 LAB — MICROSCOPIC EXAMINATION: WBC, UA: >30 /hpf — AB (ref 0–5)

## 2020-05-12 ENCOUNTER — Encounter: Payer: Self-pay | Admitting: Urology

## 2020-05-12 ENCOUNTER — Encounter
Admission: RE | Disposition: A | Discharge status: Home / Self Care | Payer: Self-pay | Source: Home / Self Care | Attending: Urology

## 2020-05-12 ENCOUNTER — Ambulatory Visit
Admission: RE | Admit: 2020-05-12 | Discharge: 2020-05-12 | Disposition: A | Discharge status: Home / Self Care | Payer: BC Managed Care – PPO | Attending: Urology | Admitting: Urology

## 2020-05-12 ENCOUNTER — Ambulatory Visit: Payer: BC Managed Care – PPO

## 2020-05-12 DIAGNOSIS — N132 Hydronephrosis with renal and ureteral calculous obstruction: Secondary | ICD-10-CM | POA: Insufficient documentation

## 2020-05-12 DIAGNOSIS — R519 Headache, unspecified: Secondary | ICD-10-CM | POA: Diagnosis not present

## 2020-05-12 DIAGNOSIS — N6320 Unspecified lump in the left breast, unspecified quadrant: Secondary | ICD-10-CM

## 2020-05-12 DIAGNOSIS — Z885 Allergy status to narcotic agent status: Secondary | ICD-10-CM | POA: Insufficient documentation

## 2020-05-12 DIAGNOSIS — Z803 Family history of malignant neoplasm of breast: Secondary | ICD-10-CM | POA: Insufficient documentation

## 2020-05-12 DIAGNOSIS — Z884 Allergy status to anesthetic agent status: Secondary | ICD-10-CM | POA: Diagnosis not present

## 2020-05-12 DIAGNOSIS — N201 Calculus of ureter: Secondary | ICD-10-CM

## 2020-05-12 DIAGNOSIS — Z881 Allergy status to other antibiotic agents status: Secondary | ICD-10-CM | POA: Diagnosis not present

## 2020-05-12 DIAGNOSIS — R928 Other abnormal and inconclusive findings on diagnostic imaging of breast: Secondary | ICD-10-CM

## 2020-05-12 HISTORY — PX: EXTRACORPOREAL SHOCK WAVE LITHOTRIPSY: SHX1557

## 2020-05-12 IMAGING — CR DG ABDOMEN 1V
2 series · 2 of 2 positions shown · non-contrast
Comparison: CT from [DATE]

CLINICAL DATA: Preop evaluation for upcoming surgery

EXAM:
ABDOMEN - 1 VIEW

[abdomen kub (1 of 2)]
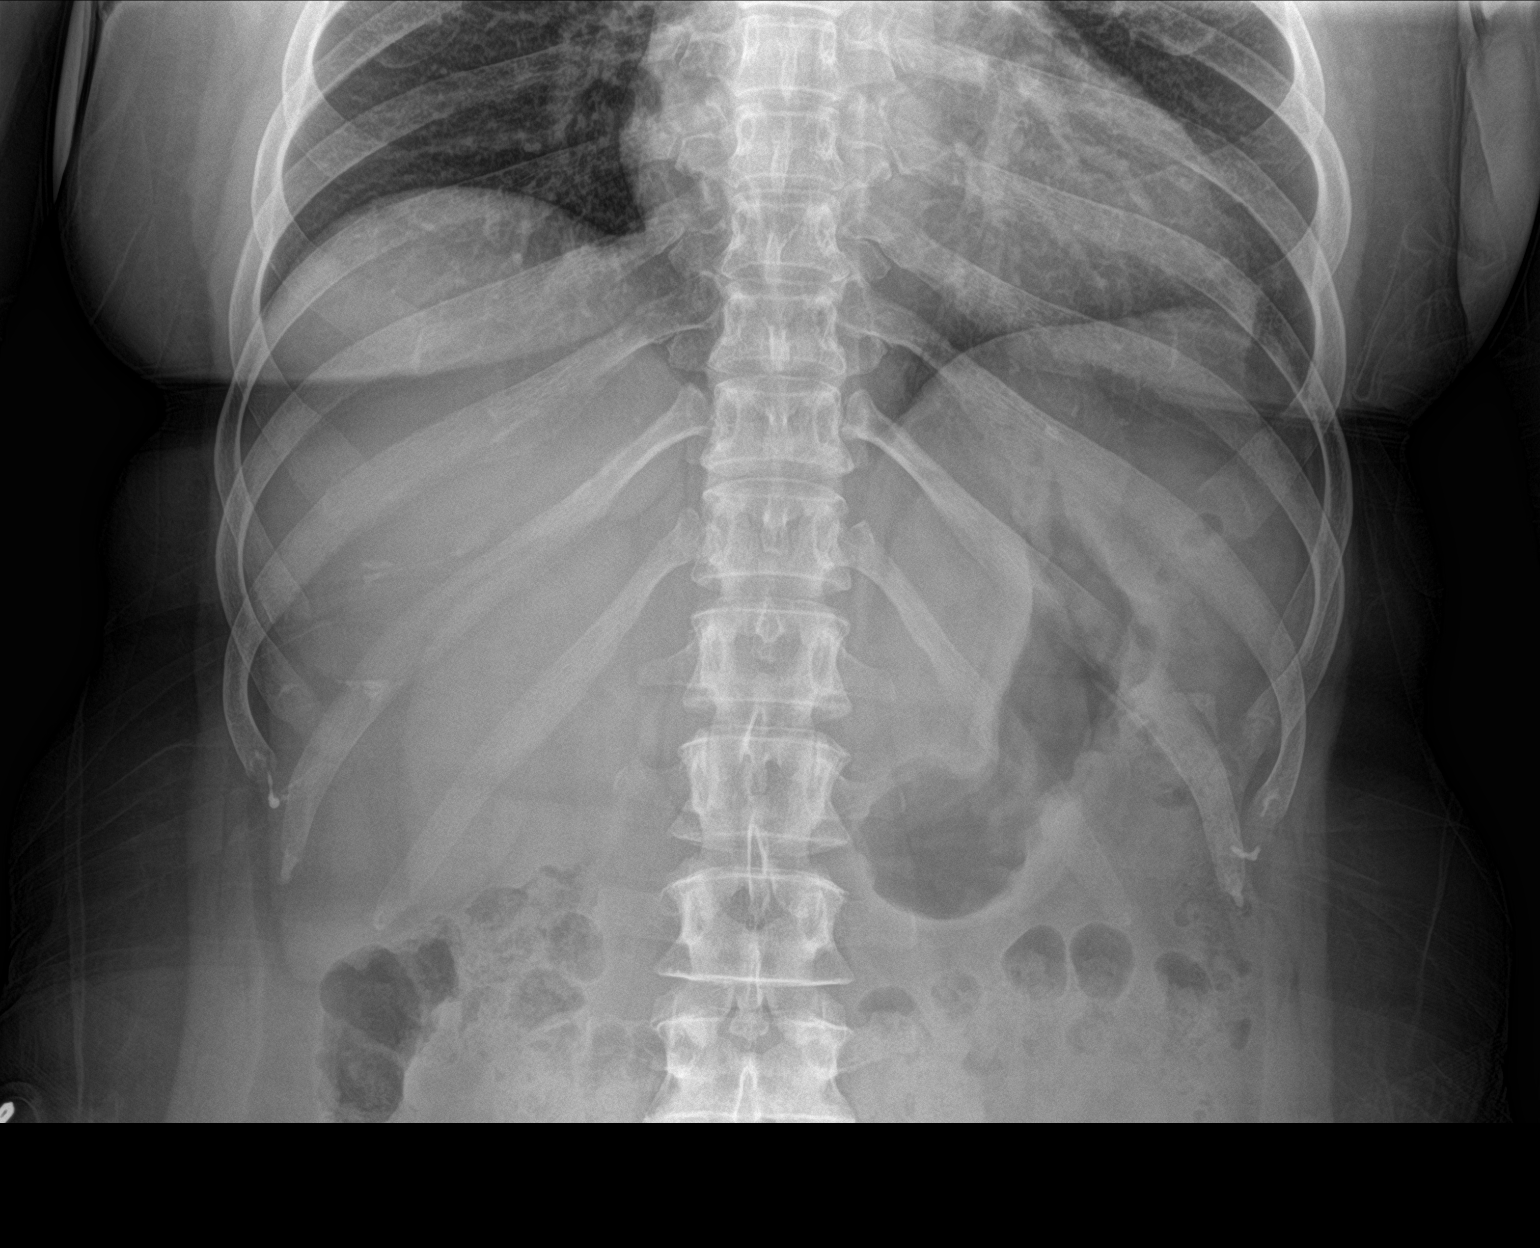

[abdomen kub (2 of 2)]
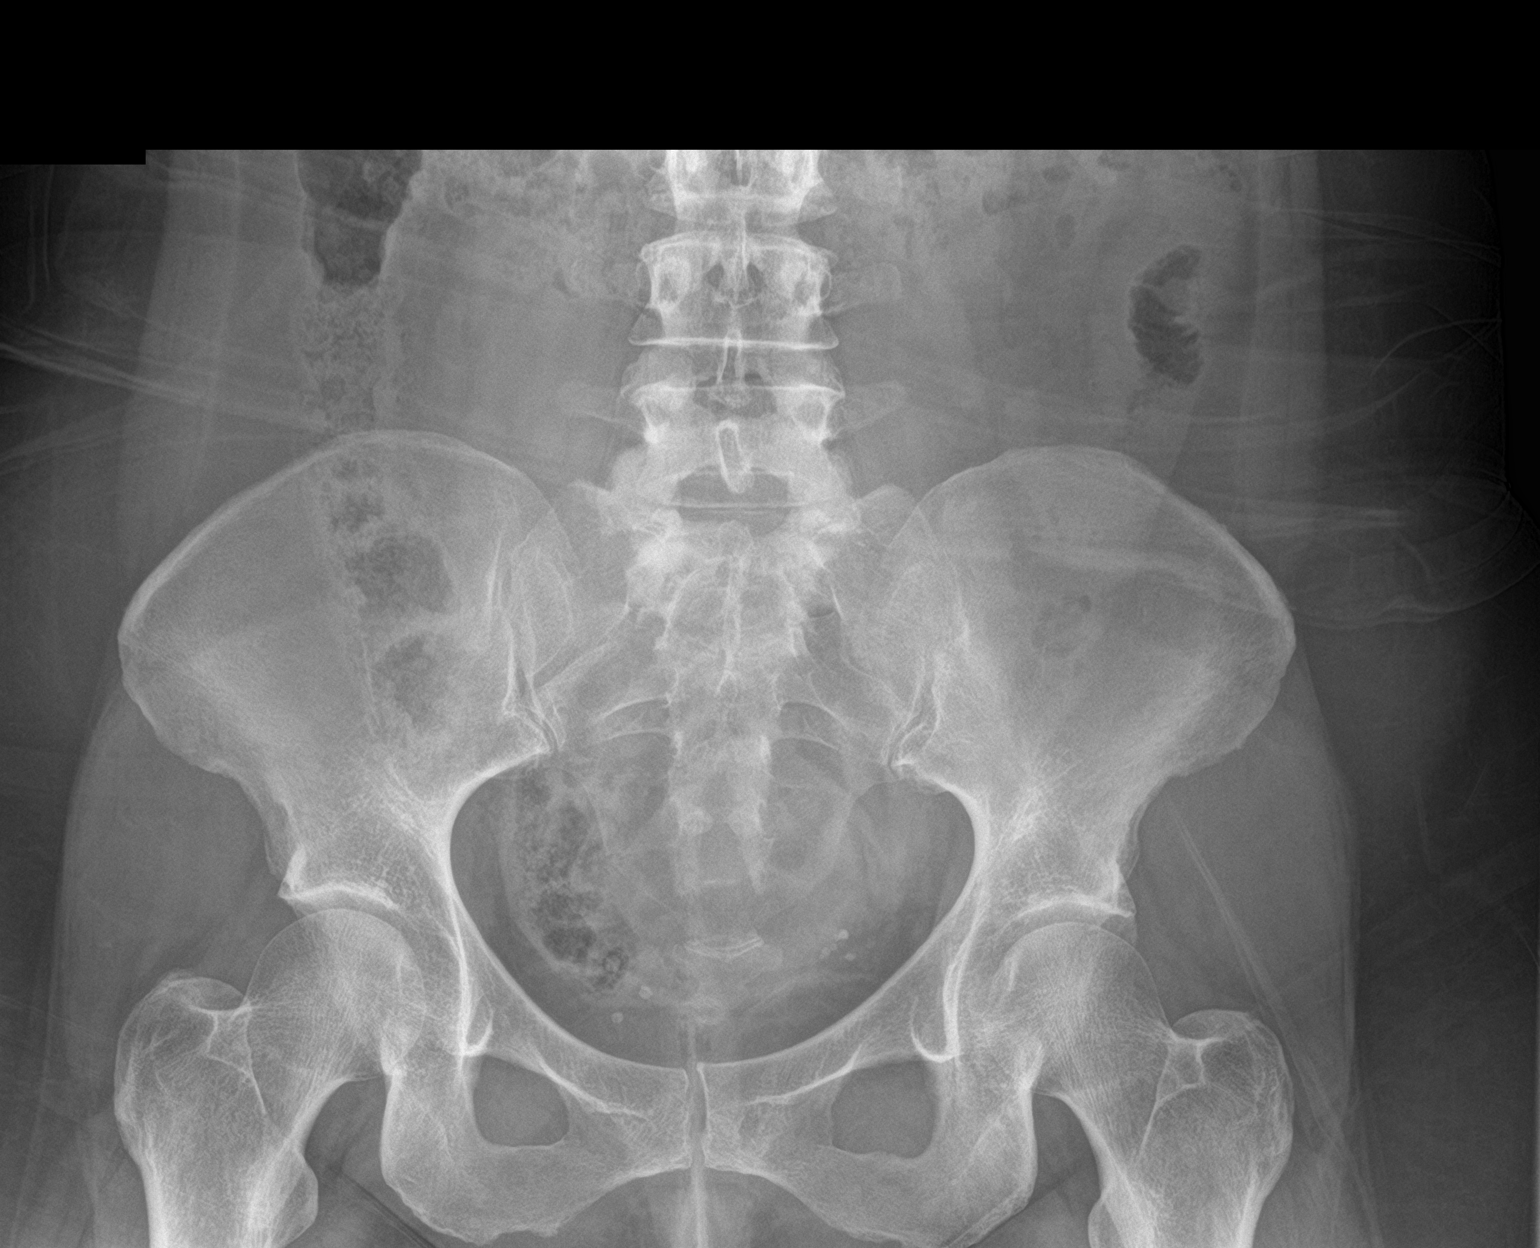

[2 of 2 positions shown; findings below may reference images not displayed]

FINDINGS: Previously seen right mid ureteral stone is not as well visualized
as on prior CT examination. It is felt the lies superior aspect of
the sacrum on the right. Phleboliths are noted within the pelvis. No
other focal abnormality is seen.
IMPRESSION: Faint calcification is noted over the right sacrum which is felt to
correspond with the mid ureteral stone on the right seen on the
prior CT.

## 2020-05-12 SURGERY — LITHOTRIPSY, ESWL
Anesthesia: Moderate Sedation | Laterality: Right

## 2020-05-12 MED ORDER — ONDANSETRON HCL 4 MG/2ML IJ SOLN
INTRAMUSCULAR | Status: AC
  Administered 2020-05-12: 4 mg
  Filled 2020-05-12: qty 2

## 2020-05-12 MED ORDER — ONDANSETRON HCL 4 MG/2ML IJ SOLN: 4.0000 mg | Freq: Once | INTRAMUSCULAR | Status: AC

## 2020-05-12 MED ORDER — DIPHENHYDRAMINE HCL 25 MG PO CAPS
ORAL_CAPSULE | ORAL | Status: AC
  Administered 2020-05-12: 25 mg via ORAL
  Filled 2020-05-12: qty 1

## 2020-05-12 MED ORDER — HYDROCODONE-ACETAMINOPHEN 5-325 MG PO TABS: 1.0000 | ORAL_TABLET | Freq: Four times a day (QID) | ORAL | 0 refills | Status: AC | PRN

## 2020-05-12 MED ORDER — CEPHALEXIN 500 MG PO CAPS
ORAL_CAPSULE | ORAL | Status: AC
  Administered 2020-05-12: 500 mg via ORAL
  Filled 2020-05-12: qty 1

## 2020-05-12 MED ORDER — CEPHALEXIN 500 MG PO CAPS: 500.0000 mg | ORAL_CAPSULE | ORAL | Status: AC

## 2020-05-12 MED ORDER — ONDANSETRON HCL 4 MG PO TABS: 4.0000 mg | ORAL_TABLET | Freq: Three times a day (TID) | ORAL | 0 refills | Status: AC | PRN

## 2020-05-12 MED ORDER — TAMSULOSIN HCL 0.4 MG PO CAPS: 0.4000 mg | ORAL_CAPSULE | Freq: Every day | ORAL | 0 refills | Status: DC

## 2020-05-12 MED ORDER — DIAZEPAM 5 MG PO TABS
ORAL_TABLET | ORAL | Status: AC
  Administered 2020-05-12: 10 mg via ORAL
  Filled 2020-05-12: qty 2

## 2020-05-12 MED ORDER — SODIUM CHLORIDE 0.9 % IV SOLN: INTRAVENOUS | Status: DC

## 2020-05-12 MED ORDER — DIAZEPAM 5 MG PO TABS: 10.0000 mg | ORAL_TABLET | ORAL | Status: AC

## 2020-05-12 MED ORDER — DIPHENHYDRAMINE HCL 25 MG PO CAPS: 25.0000 mg | ORAL_CAPSULE | ORAL | Status: AC

## 2020-05-12 NOTE — Discharge Instructions (Signed)

## 2020-05-12 NOTE — Interval H&P Note (Signed)
UROLOGY H&P UPDATE  Agree with prior H&P dated 05/10/20 by Dr. Apolinar Junes. 35mm right mid ureteral stone with renal colic, she denies any UTI symptoms of dysuria, urgency/frequency, fevers/chills. Urine culture grew 10-25k E Coli, suspect asymptomatic bacteruria. She was started on Keflex by Dr. Apolinar Junes on 2/8.   Cardiac: RRR Lungs: CTA bilaterally  Laterality: right Procedure: Shockwave lithotripsy   Informed consent obtained, we specifically discussed the risks of bleeding/hematoma, infection/sepsis, obstructive fragments/steinstrasse, post-operative pain, need for additional procedures.  Sondra Come, MD 05/12/2020

## 2020-05-12 NOTE — Brief Op Note (Signed)
05/12/2020  10:37 AM  PATIENT:  Carolyn Trevino  55 y.o. female  PRE-OPERATIVE DIAGNOSIS:  RIGHT 63mm mid ureteral stone  POST-OPERATIVE DIAGNOSIS:  Same  PROCEDURE:  Procedure(s): EXTRACORPOREAL SHOCK WAVE LITHOTRIPSY (ESWL) (Right)  SURGEON:  Surgeon(s) and Role:    * Sondra Come, MD - Primary  ANESTHESIA: Conscious Sedation  EBL:  None  Drains: None  Specimen: None  Findings:  1. Tolerated SWL very well, excellent focus of shocks on stone and appeared to smudge/lighten on KUB at conclusion of case  DISPO: Flomax, pain meds PRN, RTC 2 weeks KUB  Legrand Rams, MD 05/12/2020

## 2020-05-13 ENCOUNTER — Encounter: Payer: Self-pay | Admitting: Urology

## 2020-05-13 ENCOUNTER — Other Ambulatory Visit: Payer: Self-pay | Admitting: Obstetrics and Gynecology

## 2020-05-13 DIAGNOSIS — R928 Other abnormal and inconclusive findings on diagnostic imaging of breast: Secondary | ICD-10-CM

## 2020-05-13 DIAGNOSIS — N6320 Unspecified lump in the left breast, unspecified quadrant: Secondary | ICD-10-CM

## 2020-05-14 LAB — CULTURE, URINE COMPREHENSIVE

## 2020-05-20 ENCOUNTER — Ambulatory Visit
Admission: RE | Admit: 2020-05-20 | Discharge: 2020-05-20 | Disposition: A | Discharge status: Home / Self Care | Payer: BC Managed Care – PPO | Source: Ambulatory Visit | Attending: Internal Medicine | Admitting: Internal Medicine

## 2020-05-20 ENCOUNTER — Other Ambulatory Visit: Payer: Self-pay

## 2020-05-20 DIAGNOSIS — K869 Disease of pancreas, unspecified: Secondary | ICD-10-CM

## 2020-05-20 DIAGNOSIS — N132 Hydronephrosis with renal and ureteral calculous obstruction: Secondary | ICD-10-CM | POA: Diagnosis present

## 2020-05-20 IMAGING — MR MR ABDOMEN WO/W CM MRCP
19 of 21 series · 44 of 48 positions shown · IV contrast (7ml Gadavist)
Comparison: No prior abdominal MRI. CT the abdomen and pelvis
[DATE].

CLINICAL DATA: 54-year-old female with history of flank pain for 1
week.

EXAM:
MRI ABDOMEN WITHOUT AND WITH CONTRAST (INCLUDING MRCP)
TECHNIQUE: Multiplanar multisequence MR imaging of the abdomen was performed
both before and after the administration of intravenous contrast.
Heavily T2-weighted images of the biliary and pancreatic ducts were
obtained, and three-dimensional MRCP images were rendered by post
processing.
CONTRAST:  7mL GADAVIST GADOBUTROL 1 MMOL/ML IV SOLN

[Series 3: T2 · coronal · 6.0mm · 1.06mm/px · 2 of 30 slices shown (1 of 2)]
[im 1/30]
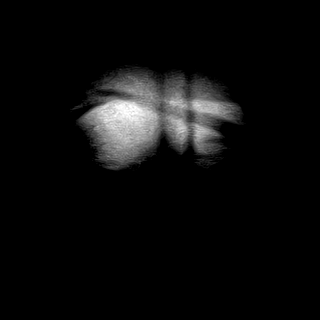
[im 30/30]
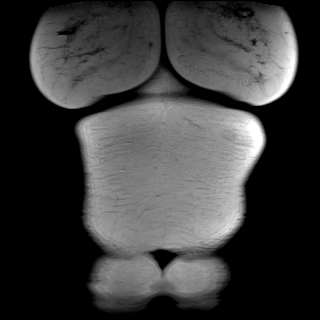

[Series 4: T2 · axial · 6.0mm · 1.19mm/px · z∈[-110,+113]mm · 2 of 32 slices shown (2 of 2)]
[im 1/32]
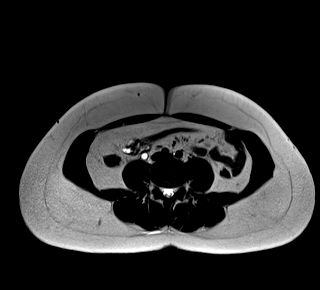
[im 32/32]
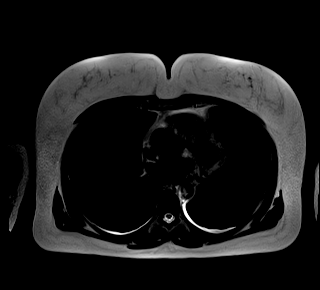

[Series 5: T1 · axial · 6.0mm · 0.74mm/px · 1 of 32 slices shown (1 of 2)]
[im 1/32]
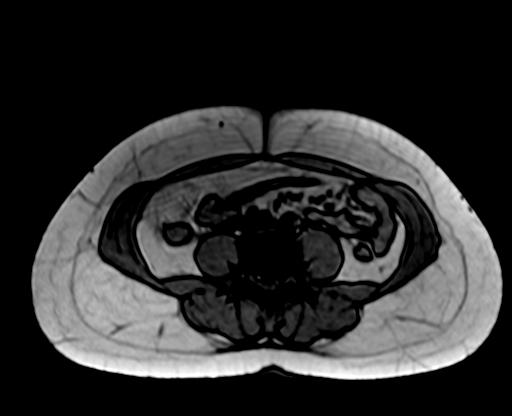

[Series 5: T1 · axial · 6.0mm · 0.74mm/px · 1 of 32 slices shown (2 of 2)]
[im 1/32]
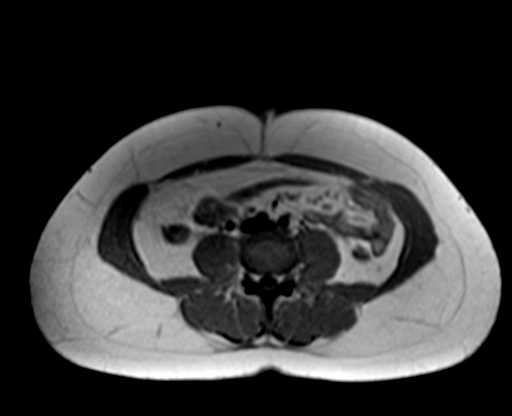

[Series 8: T2 fat-sat · axial · 6.0mm · 1.19mm/px · 1 of 30 slices shown]
[im 1/30]
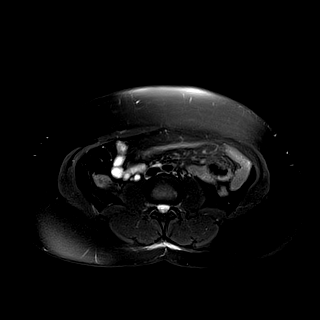

[Series 9: ax dwi_tracew · axial · 6.0mm · 1.42mm/px · z∈[-89,+120]mm · 4 of 90 slices shown]
[im 1/90]
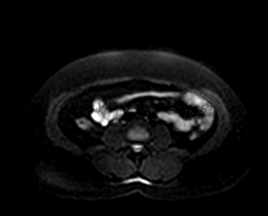
[im 30/90]
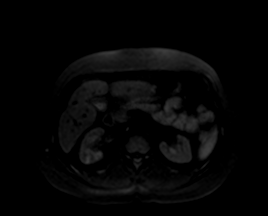
[im 60/90]
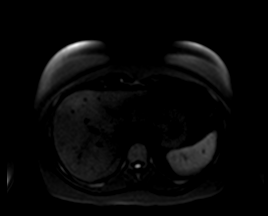
[im 90/90]
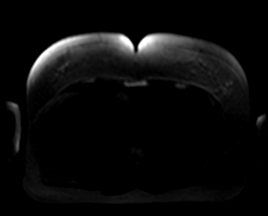

[Series 10: ax dwi_adc · axial · 6.0mm · 1.42mm/px · 1 of 30 slices shown]
[im 1/30]
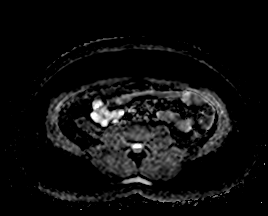

[Series 14: MRCP · coronal · 3.0mm · 1.12mm/px · 1 of 17 slices shown]
[im 1/17]
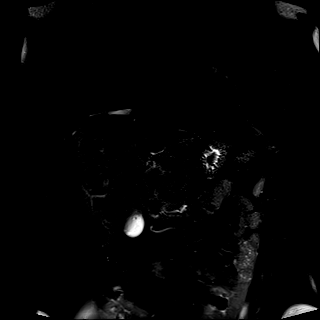

[Series 15: radials · coronal · 50.0mm · 0.78mm/px · 1 of 5 slices shown]
[im 1/5]
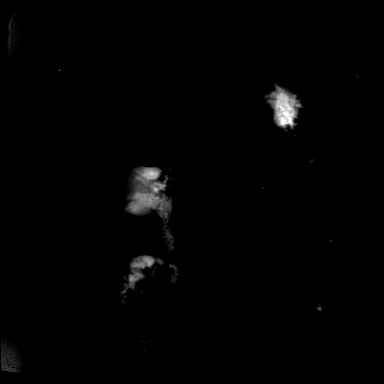

[Series 16: T1 dynamic fat-sat · axial · non-contrast · 3.0mm · 1.19mm/px · z∈[-105,+108]mm · 3 of 72 slices shown (1 of 5)]
[im 1/72]
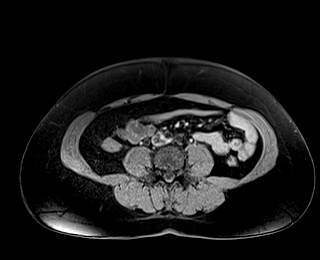
[im 36/72]
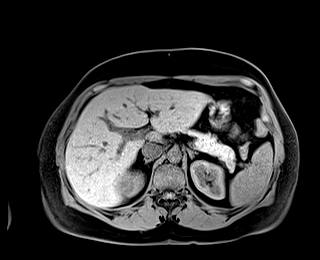
[im 72/72]
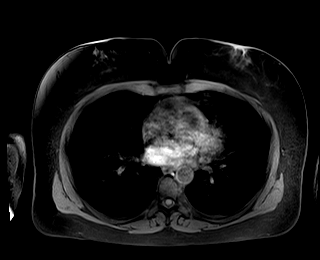

[Series 17: T1 dynamic fat-sat post-contrast · axial · 3.0mm · 1.19mm/px · z∈[-105,+108]mm · 3 of 72 slices shown (1 of 4)]
[im 1/72]
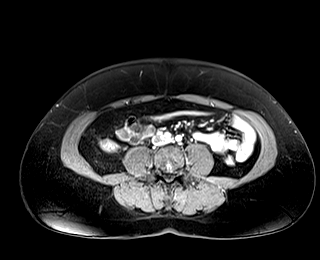
[im 36/72]
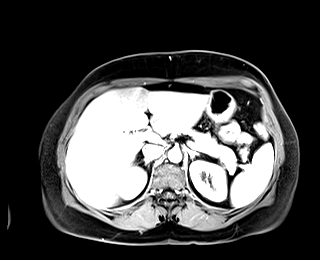
[im 72/72]
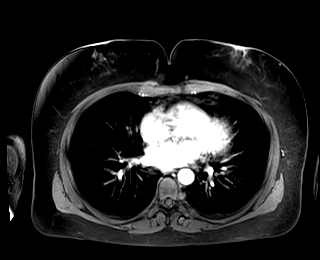

[Series 18: T1 dynamic fat-sat · axial · 3.0mm · 1.19mm/px · z∈[-105,+108]mm · 3 of 72 slices shown (2 of 5)]
[im 1/72]
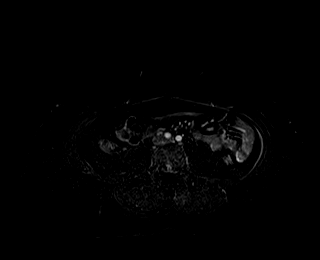
[im 36/72]
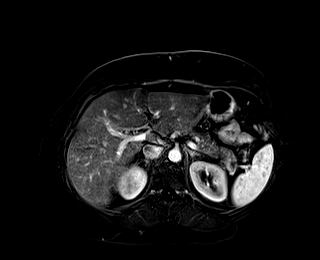
[im 72/72]
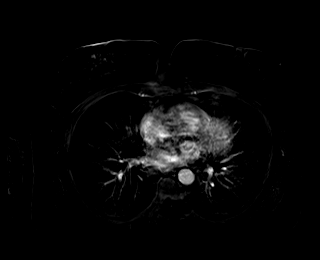

[Series 19: T1 dynamic fat-sat post-contrast · axial · 3.0mm · 1.19mm/px · z∈[-105,+108]mm · 3 of 72 slices shown (2 of 4)]
[im 1/72]
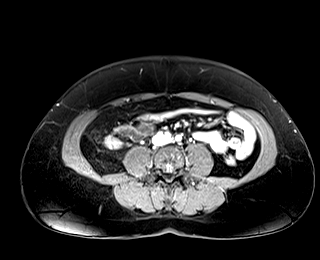
[im 36/72]
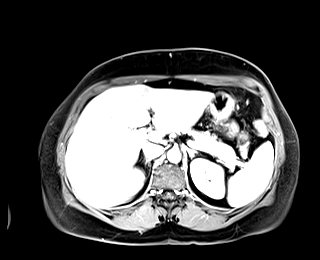
[im 72/72]
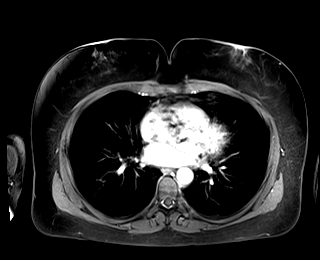

[Series 20: T1 dynamic fat-sat · axial · 3.0mm · 1.19mm/px · z∈[-105,+108]mm · 3 of 72 slices shown (3 of 5)]
[im 1/72]
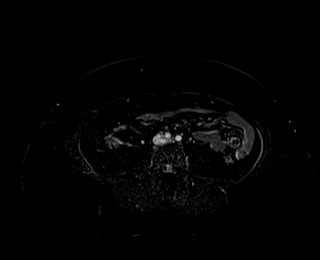
[im 36/72]
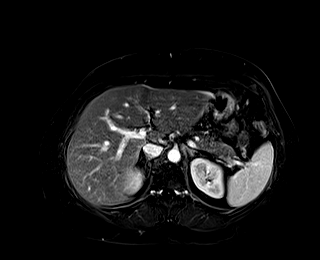
[im 72/72]
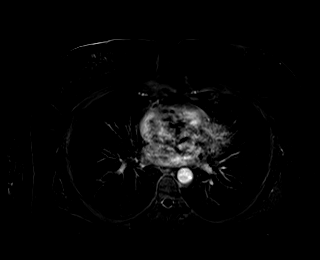

[Series 21: T1 dynamic fat-sat post-contrast · axial · 3.0mm · 1.19mm/px · z∈[-105,+108]mm · 3 of 72 slices shown (3 of 4)]
[im 1/72]
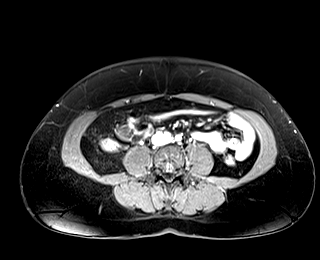
[im 36/72]
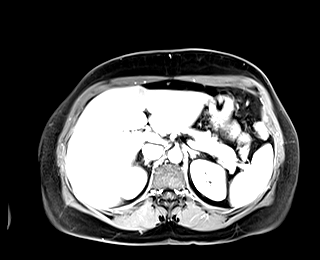
[im 72/72]
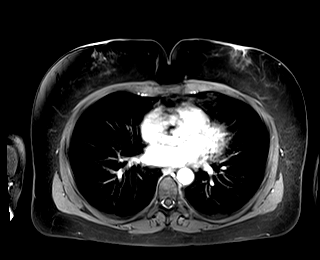

[Series 22: T1 dynamic fat-sat · axial · 3.0mm · 1.19mm/px · z∈[-105,+108]mm · 3 of 72 slices shown (4 of 5)]
[im 1/72]
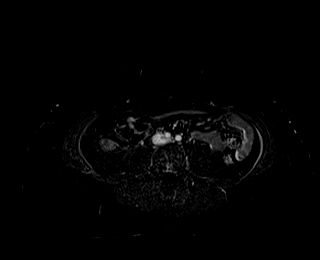
[im 36/72]
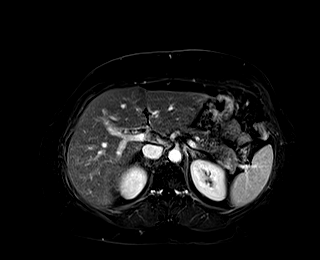
[im 72/72]
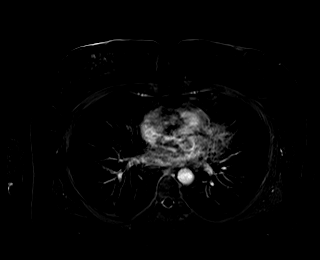

[Series 23: T1 dynamic post-contrast · coronal · 3.0mm · 1.19mm/px · 3 of 72 slices shown]
[im 1/72]
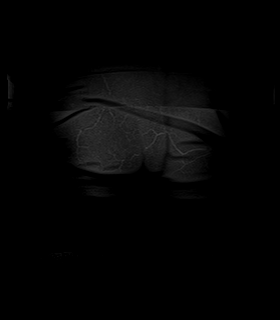
[im 36/72]
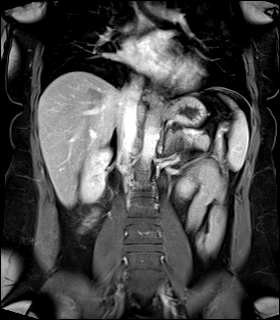
[im 72/72]
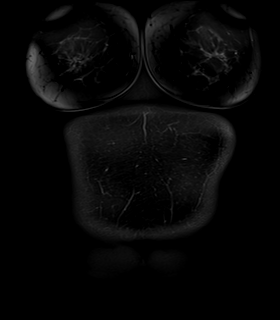

[Series 24: T1 dynamic fat-sat post-contrast · axial · 3.0mm · 1.19mm/px · z∈[-105,+108]mm · 3 of 72 slices shown (4 of 4)]
[im 1/72]
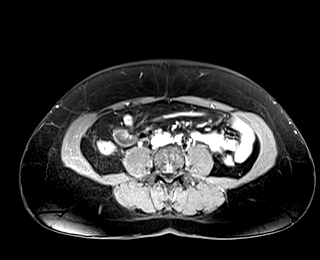
[im 36/72]
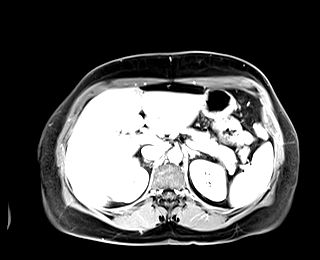
[im 72/72]
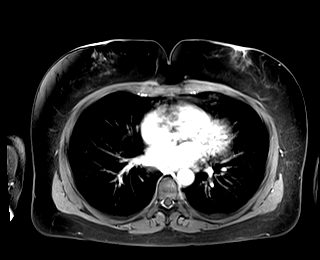

[Series 25: T1 dynamic fat-sat · axial · 3.0mm · 1.19mm/px · z∈[-105,+108]mm · 3 of 72 slices shown (5 of 5)]
[im 1/72]
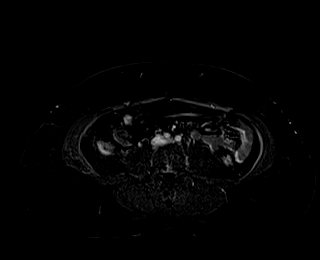
[im 36/72]
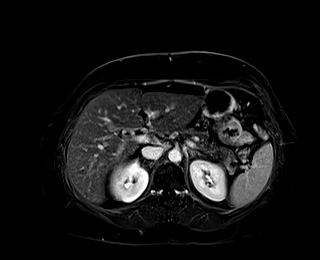
[im 72/72]
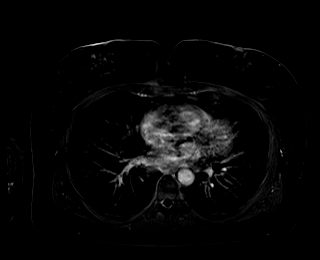

[44 of 48 positions shown; findings below may reference images not displayed]

FINDINGS: Lower chest: Unremarkable.

Hepatobiliary: Subcentimeter T1 hypointense, T2 hyperintense,
nonenhancing lesions in the liver, compatible with tiny cysts and/or
biliary hamartomas. No other suspicious hepatic lesions. No intra or
extrahepatic biliary ductal dilatation. Gallbladder is normal in
appearance.

Pancreas: In the tail of the pancreas there is a well-defined T1
hypointense, T2 hyperintense, nonenhancing lesion measuring 1.4 x
1.0 cm (axial image 18 of series 4). This does not appear to
communicate with the main pancreatic duct. No other pancreatic mass.
No pancreatic ductal dilatation. No peripancreatic fluid collections
or inflammatory changes.

Spleen:  Unremarkable.

Adrenals/Urinary Tract: Moderate right hydroureteronephrosis,
similar to prior CT the abdomen and pelvis [DATE]. Left kidney
and bilateral adrenal glands are normal in appearance. No left
hydroureteronephrosis.

Stomach/Bowel: The appearance of the stomach and visualized portions
of small bowel and colon is unremarkable.

Vascular/Lymphatic: No aneurysm identified in the visualized
abdominal vasculature. No lymphadenopathy noted in the abdomen.

Other: No significant volume of ascites noted in the visualized
portions of the peritoneal cavity.

Musculoskeletal: No aggressive appearing osseous lesions are noted
in the visualized portions of the skeleton.
IMPRESSION: 1. 1.4 x 1.0 cm cystic lesion in the tail of the pancreas is favored
to represent a small benign lesion such as a pancreatic pseudocyst.
Recommend follow up pre and post contrast MRI/MRCP or pancreatic
protocol CT in 1 year. This recommendation follows ACR consensus
guidelines: Management of Incidental Pancreatic Cysts: A White Paper
of the ACR Incidental Findings Committee. [HOSPITAL]
[SL];[DATE].
2. Moderate right hydroureteronephrosis, similar to prior CT the
abdomen and pelvis [DATE].
[DATE]. Additional incidental findings, as above.

## 2020-05-20 MED ORDER — GADOBUTROL 1 MMOL/ML IV SOLN
7.0000 mL | Freq: Once | INTRAVENOUS | Status: AC | PRN
  Administered 2020-05-20: 7 mL via INTRAVENOUS

## 2020-05-23 ENCOUNTER — Other Ambulatory Visit: Payer: Self-pay | Admitting: Physician Assistant

## 2020-05-23 ENCOUNTER — Ambulatory Visit
Admission: RE | Admit: 2020-05-23 | Discharge: 2020-05-23 | Disposition: A | Discharge status: Home / Self Care | Payer: BC Managed Care – PPO | Source: Ambulatory Visit | Attending: Obstetrics and Gynecology | Admitting: Obstetrics and Gynecology

## 2020-05-23 ENCOUNTER — Other Ambulatory Visit: Payer: Self-pay

## 2020-05-23 DIAGNOSIS — N6320 Unspecified lump in the left breast, unspecified quadrant: Secondary | ICD-10-CM

## 2020-05-23 DIAGNOSIS — R928 Other abnormal and inconclusive findings on diagnostic imaging of breast: Secondary | ICD-10-CM

## 2020-05-23 DIAGNOSIS — N201 Calculus of ureter: Secondary | ICD-10-CM

## 2020-05-23 IMAGING — US US BREAST*L* LIMITED INC AXILLA
1 series · 6 of 6 positions shown · non-contrast
Comparison: Recent screening mammogram dated [DATE].

CLINICAL DATA: Patient returns today to evaluate a possible LEFT
breast mass identified on recent screening mammogram.

EXAM:
DIGITAL DIAGNOSTIC UNILATERAL LEFT MAMMOGRAM WITH TOMOSYNTHESIS AND
CAD; ULTRASOUND LEFT BREAST LIMITED
TECHNIQUE: Left digital diagnostic mammography and breast tomosynthesis was
performed. The images were evaluated with computer-aided detection.;
Targeted ultrasound examination of the left breast was performed

[Series 1: us breast*left* limited inc axilla · 0.06mm/px · 6 of 6 slices shown]
[im 1/6]
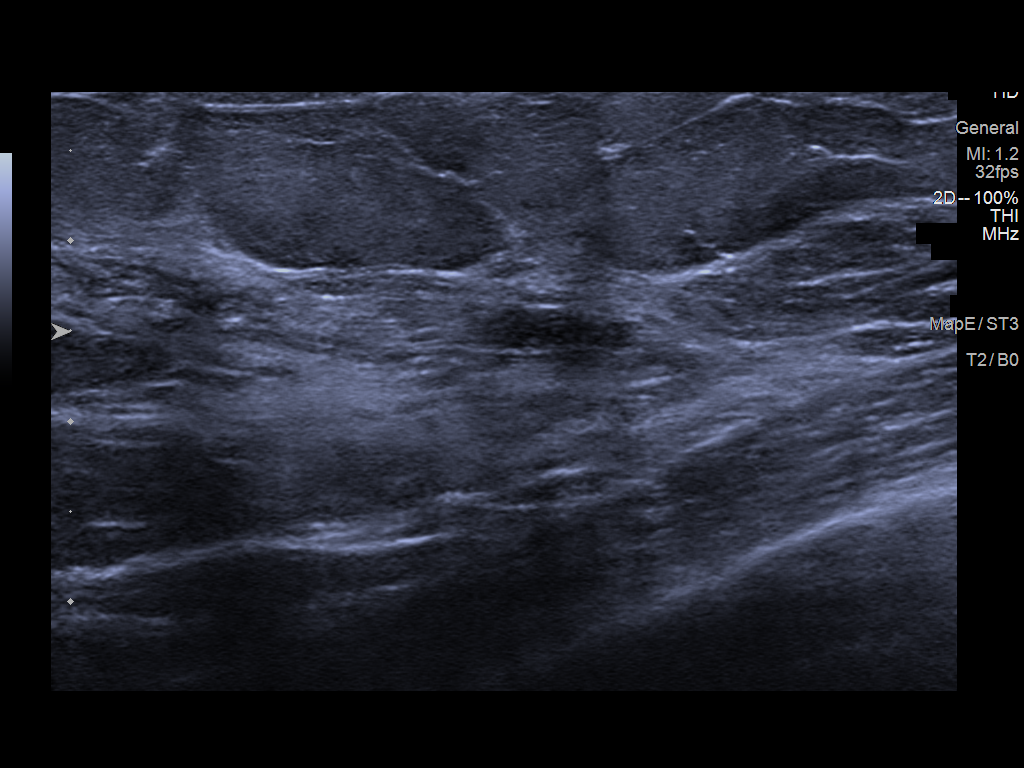
[im 2/6]
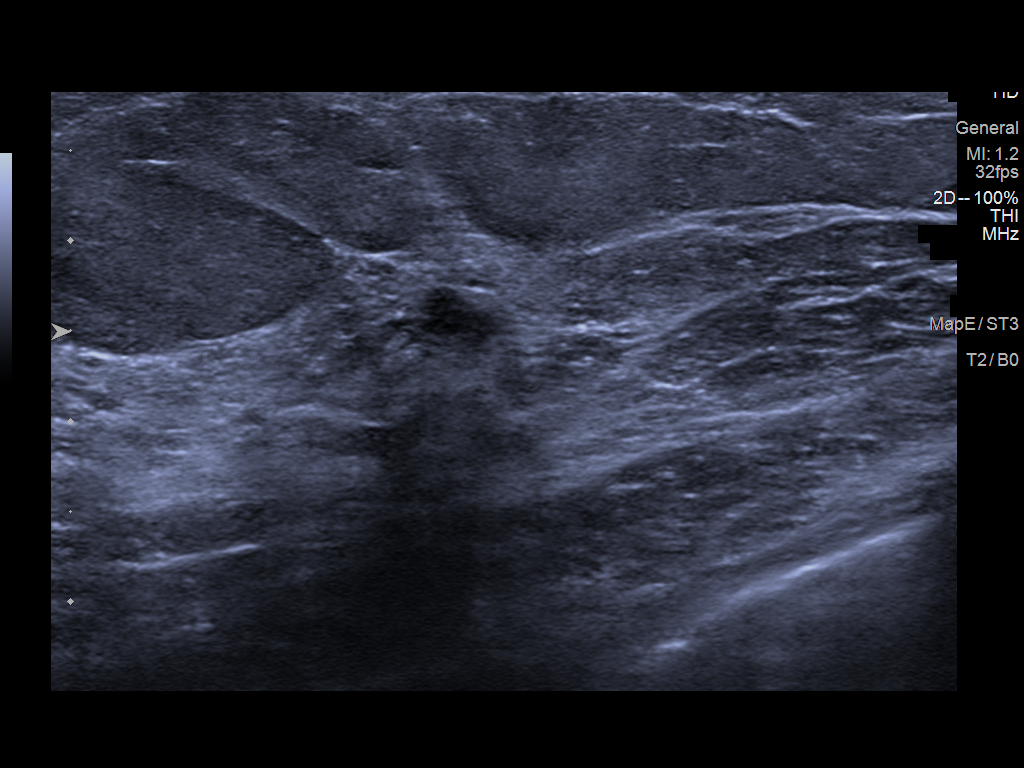
[im 3/6]
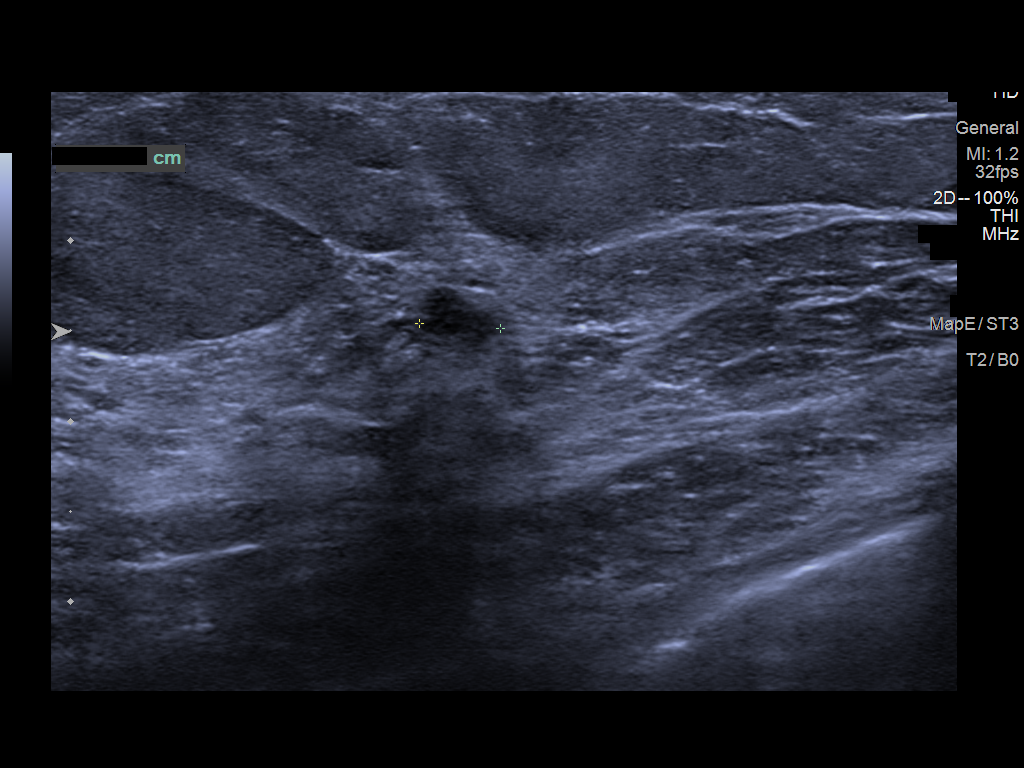
[im 4/6]
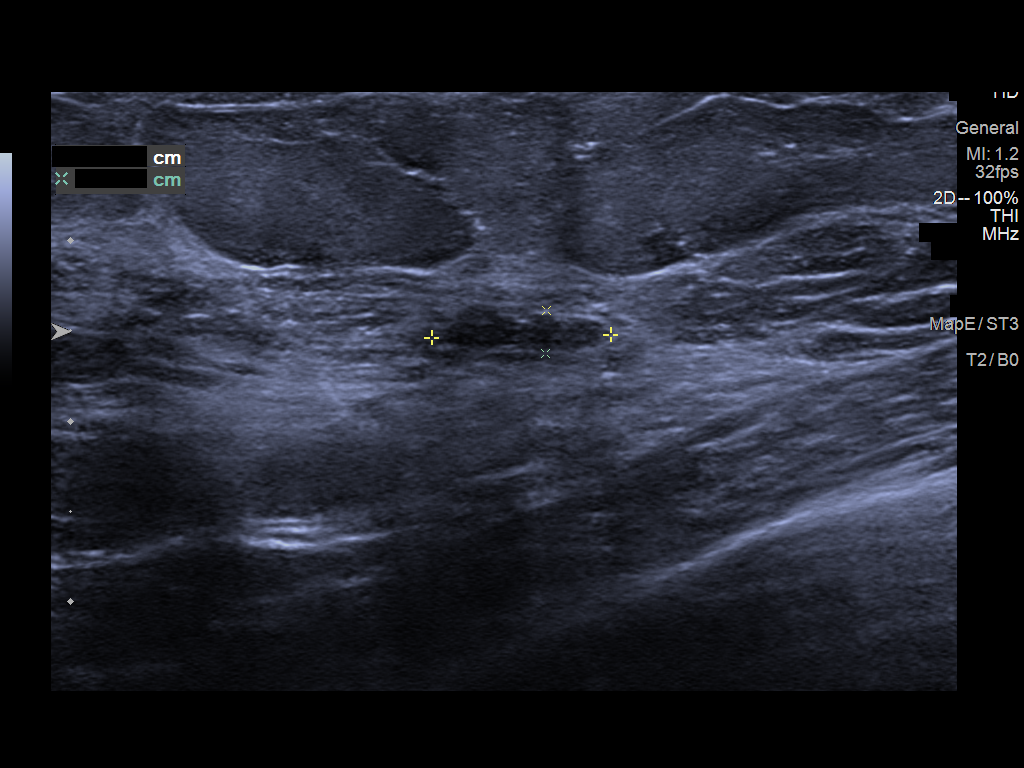
[im 5/6]
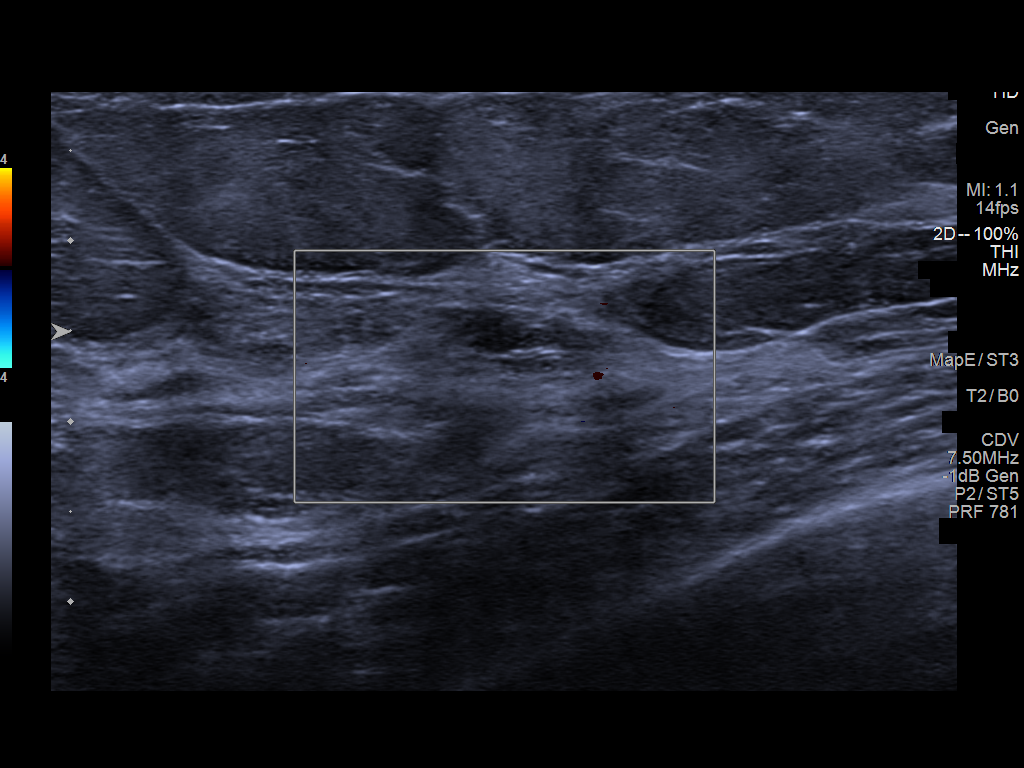
[im 6/6]
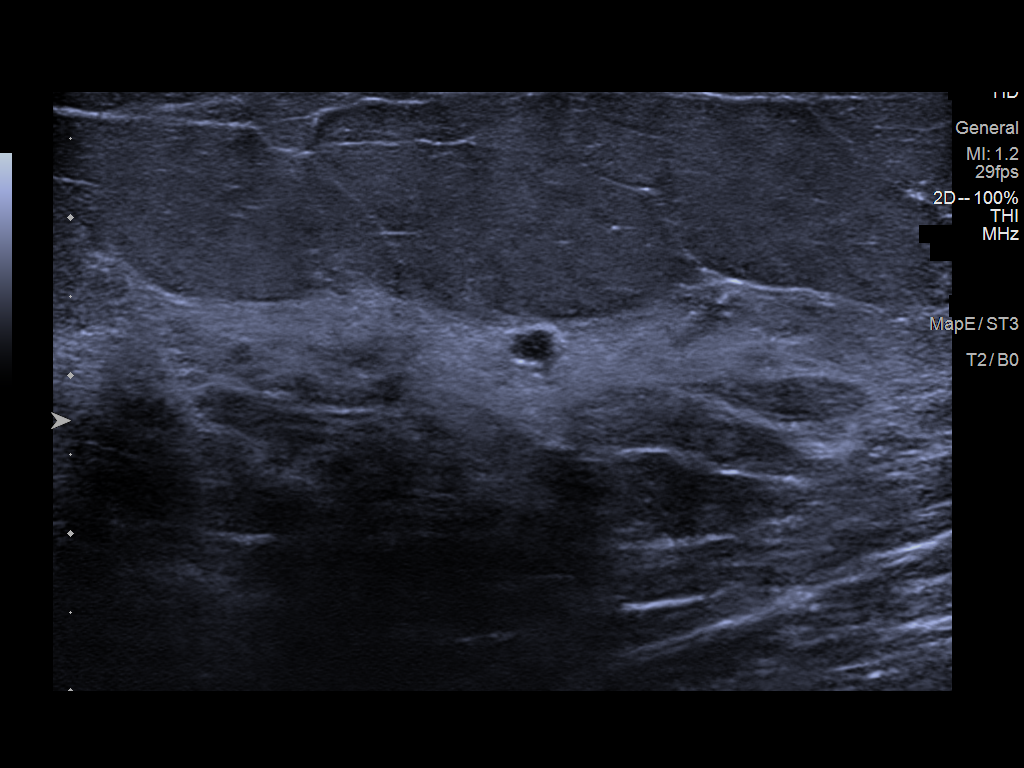

[6 of 6 positions shown; findings below may reference images not displayed]

ACR Breast Density Category b: There are scattered areas of
fibroglandular density.
FINDINGS: Oval circumscribed low-density mass is confirmed within the outer
LEFT breast, at middle to posterior depth, measuring 5 mm greatest
dimension. Smaller abutting partially obscured masses are present,
each measuring approximately 3-4 mm in size. This may represent a
benign cluster of cysts. Ultrasound will be performed for further
characterization.

Targeted ultrasound is performed, evaluating the entire outer LEFT
breast, showing an oval hypoechoic area at the 2 o'clock axis, 8 cm
from the nipple, measuring 1 x 0.2 x 0.5 cm, most suggestive of a
cluster of complicated cysts or apocrine metaplasia during real-time
ultrasound evaluation.

Additional small benign cyst is identified in the LEFT breast at the
1 o'clock axis. No suspicious solid or cystic mass is seen within
the outer LEFT breast.
IMPRESSION: Probably benign cluster of complicated cysts versus apocrine
metaplasia within the LEFT breast at the 2 o'clock axis, 8 cm from
the nipple, measuring 1 x 0.2 x 0.5 cm, corresponding to the
mammographic finding. Recommend follow-up LEFT breast diagnostic
mammogram and ultrasound in 6 months to ensure stability.

RECOMMENDATION:
LEFT breast diagnostic mammogram and ultrasound in 6 months.

I have discussed the findings and recommendations with the patient.
If applicable, a reminder letter will be sent to the patient
regarding the next appointment.

BI-RADS CATEGORY  3: Probably benign.

## 2020-05-23 IMAGING — MG MM DIGITAL DIAGNOSTIC UNILAT*L* W/ TOMO W/ CAD
4 series · 4 of 12 positions shown · non-contrast
Comparison: Recent screening mammogram dated [DATE].

CLINICAL DATA: Patient returns today to evaluate a possible LEFT
breast mass identified on recent screening mammogram.

EXAM:
DIGITAL DIAGNOSTIC UNILATERAL LEFT MAMMOGRAM WITH TOMOSYNTHESIS AND
CAD; ULTRASOUND LEFT BREAST LIMITED
TECHNIQUE: Left digital diagnostic mammography and breast tomosynthesis was
performed. The images were evaluated with computer-aided detection.;
Targeted ultrasound examination of the left breast was performed

[L CC synth-2D]
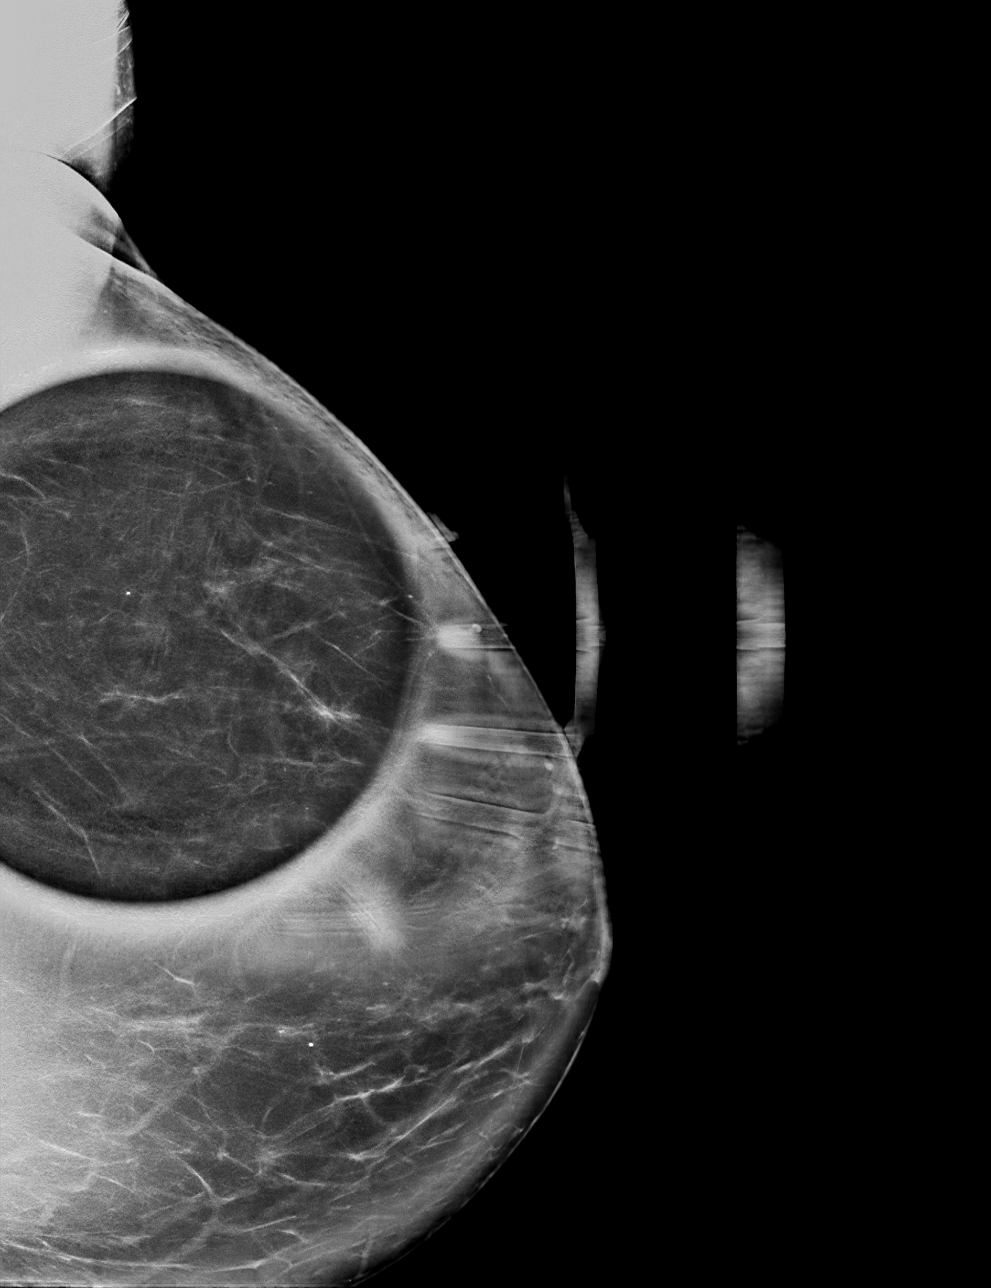

[L MLO synth-2D]
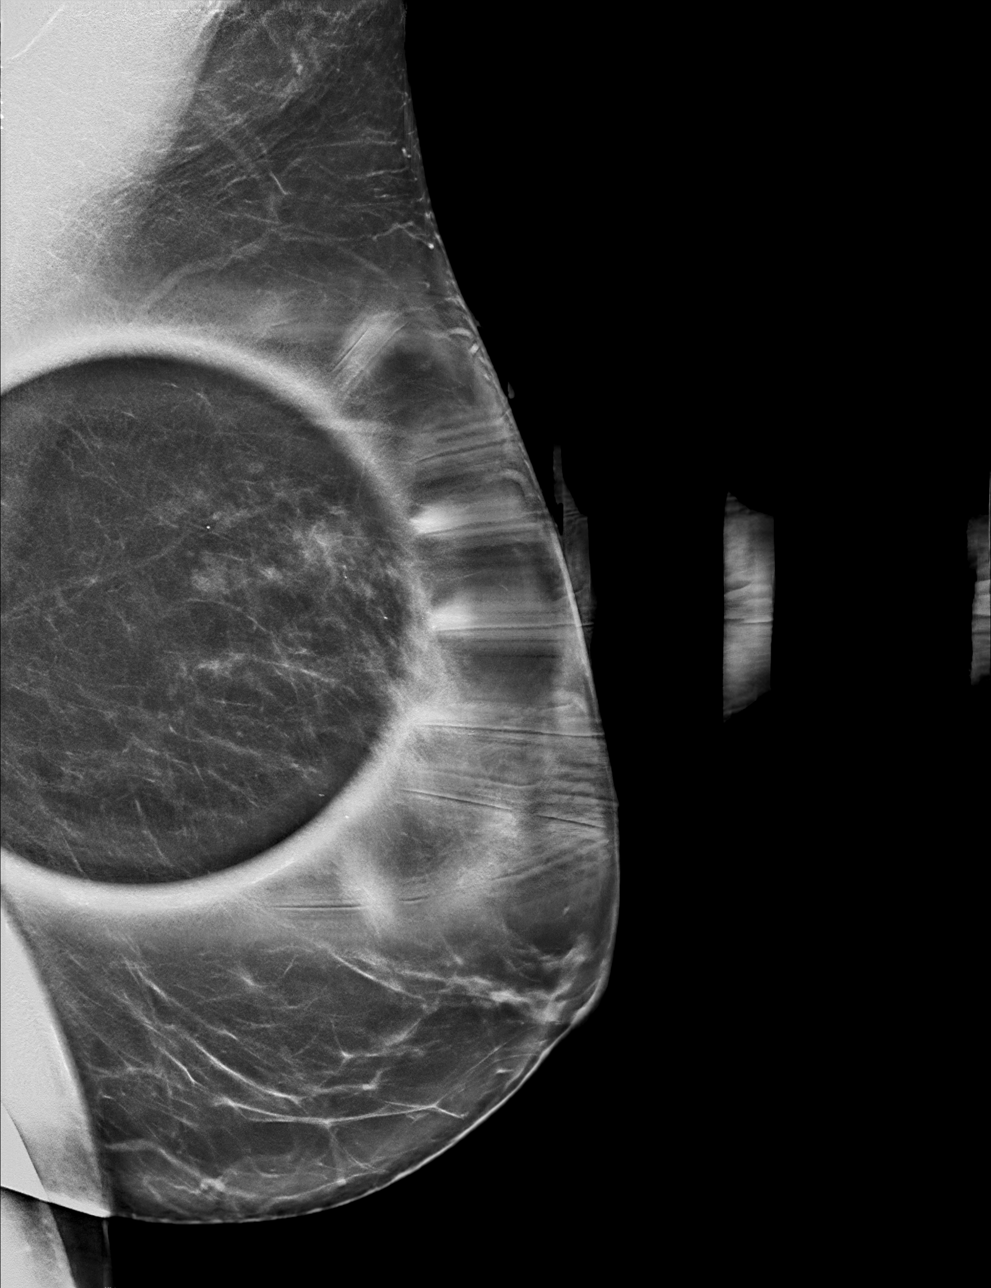

[L MLO tomo · tomo slice 43/84.0]
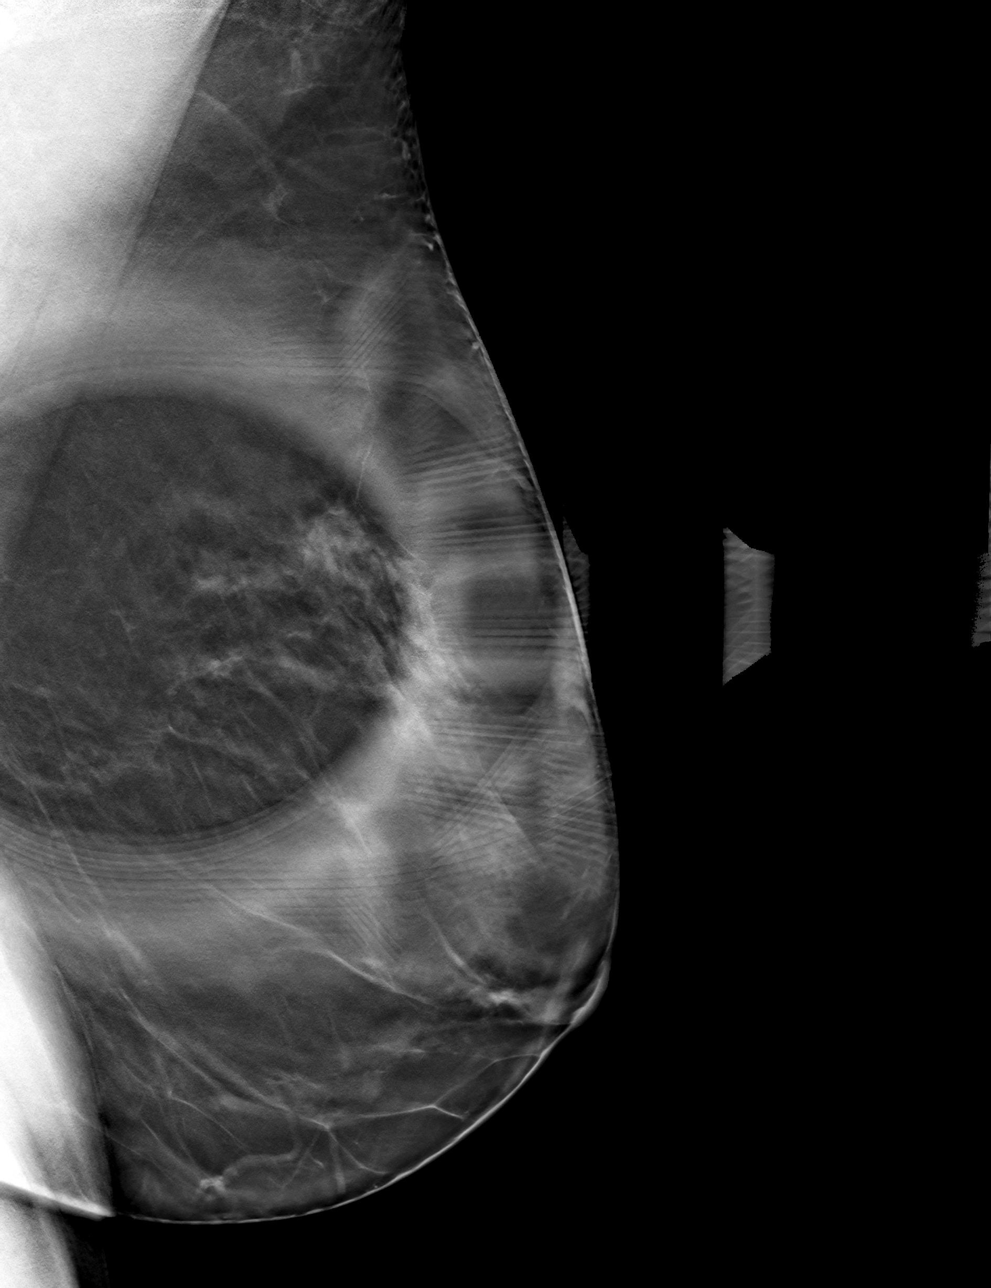

[L CC tomo · tomo slice 45/90.0]
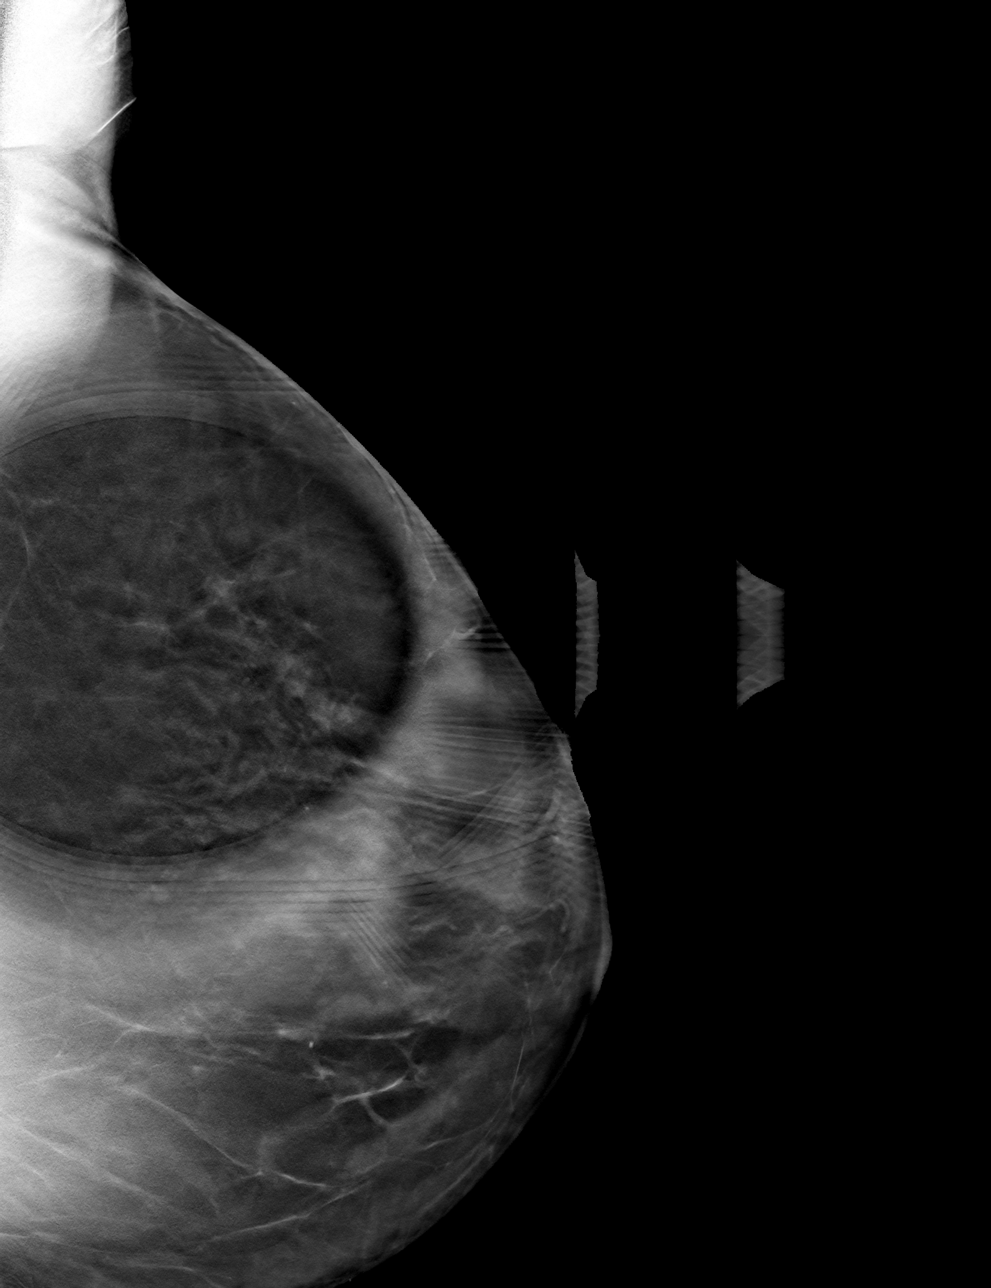

[4 of 12 positions shown; findings below may reference images not displayed]

ACR Breast Density Category b: There are scattered areas of
fibroglandular density.
FINDINGS: Oval circumscribed low-density mass is confirmed within the outer
LEFT breast, at middle to posterior depth, measuring 5 mm greatest
dimension. Smaller abutting partially obscured masses are present,
each measuring approximately 3-4 mm in size. This may represent a
benign cluster of cysts. Ultrasound will be performed for further
characterization.

Targeted ultrasound is performed, evaluating the entire outer LEFT
breast, showing an oval hypoechoic area at the 2 o'clock axis, 8 cm
from the nipple, measuring 1 x 0.2 x 0.5 cm, most suggestive of a
cluster of complicated cysts or apocrine metaplasia during real-time
ultrasound evaluation.

Additional small benign cyst is identified in the LEFT breast at the
1 o'clock axis. No suspicious solid or cystic mass is seen within
the outer LEFT breast.
IMPRESSION: Probably benign cluster of complicated cysts versus apocrine
metaplasia within the LEFT breast at the 2 o'clock axis, 8 cm from
the nipple, measuring 1 x 0.2 x 0.5 cm, corresponding to the
mammographic finding. Recommend follow-up LEFT breast diagnostic
mammogram and ultrasound in 6 months to ensure stability.

RECOMMENDATION:
LEFT breast diagnostic mammogram and ultrasound in 6 months.

I have discussed the findings and recommendations with the patient.
If applicable, a reminder letter will be sent to the patient
regarding the next appointment.

BI-RADS CATEGORY  3: Probably benign.

## 2020-05-24 ENCOUNTER — Other Ambulatory Visit: Payer: Self-pay | Admitting: Obstetrics and Gynecology

## 2020-05-24 DIAGNOSIS — N6002 Solitary cyst of left breast: Secondary | ICD-10-CM

## 2020-05-26 ENCOUNTER — Other Ambulatory Visit: Payer: Self-pay

## 2020-05-26 ENCOUNTER — Encounter: Payer: Self-pay | Admitting: Physician Assistant

## 2020-05-26 ENCOUNTER — Ambulatory Visit
Admission: RE | Admit: 2020-05-26 | Discharge: 2020-05-26 | Disposition: A | Discharge status: Home / Self Care | Payer: BC Managed Care – PPO | Source: Ambulatory Visit | Attending: Physician Assistant | Admitting: Physician Assistant

## 2020-05-26 ENCOUNTER — Ambulatory Visit
Admission: RE | Admit: 2020-05-26 | Discharge: 2020-05-26 | Disposition: A | Discharge status: Home / Self Care | Payer: BC Managed Care – PPO | Attending: Physician Assistant | Admitting: Physician Assistant

## 2020-05-26 ENCOUNTER — Ambulatory Visit (INDEPENDENT_AMBULATORY_CARE_PROVIDER_SITE_OTHER): Payer: BC Managed Care – PPO | Admitting: Physician Assistant

## 2020-05-26 VITALS — BP 145/92 | HR 124 | Ht 60.0 in | Wt 153.0 lb

## 2020-05-26 DIAGNOSIS — N201 Calculus of ureter: Secondary | ICD-10-CM

## 2020-05-26 LAB — URINALYSIS, COMPLETE
Bilirubin, UA: NEGATIVE
Glucose, UA: NEGATIVE
Ketones, UA: NEGATIVE
Nitrite, UA: NEGATIVE
Specific Gravity, UA: 1.02 (ref 1.005–1.030)
Urobilinogen, Ur: 0.2 mg/dL (ref 0.2–1.0)
pH, UA: 5.5 (ref 5.0–7.5)

## 2020-05-26 LAB — MICROSCOPIC EXAMINATION: WBC, UA: >30 /hpf — AB (ref 0–5)

## 2020-05-26 IMAGING — CR DG ABDOMEN 1V
1 series · 2 of 2 positions shown · non-contrast
Comparison: Abdominal radiograph [DATE]

CLINICAL DATA: Nephrolithiasis status post ESWL

EXAM:
ABDOMEN - 1 VIEW

[Series 1: dg abd 1 view · 0.14mm/px · 2 of 2 slices shown]
[im 1/2]
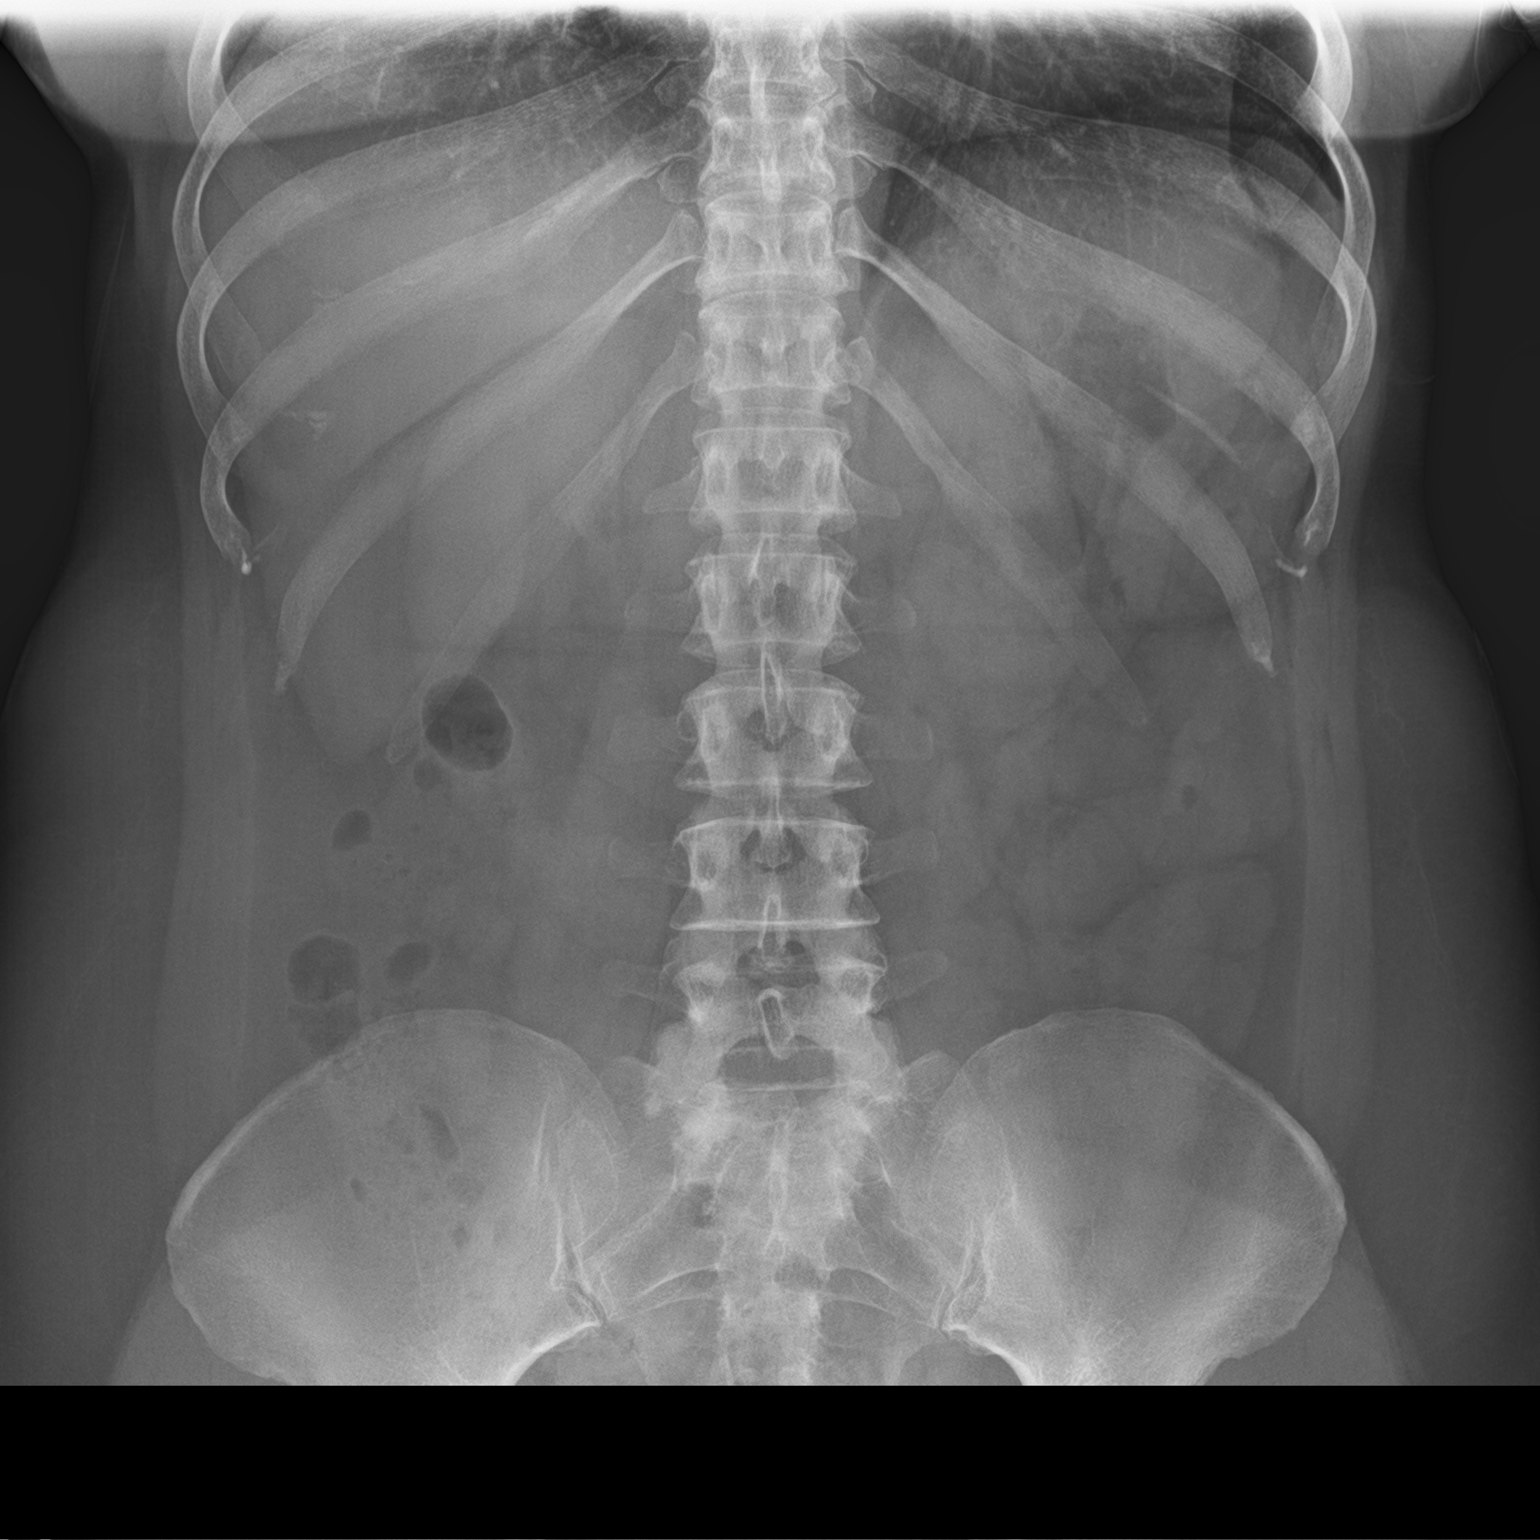
[im 2/2]
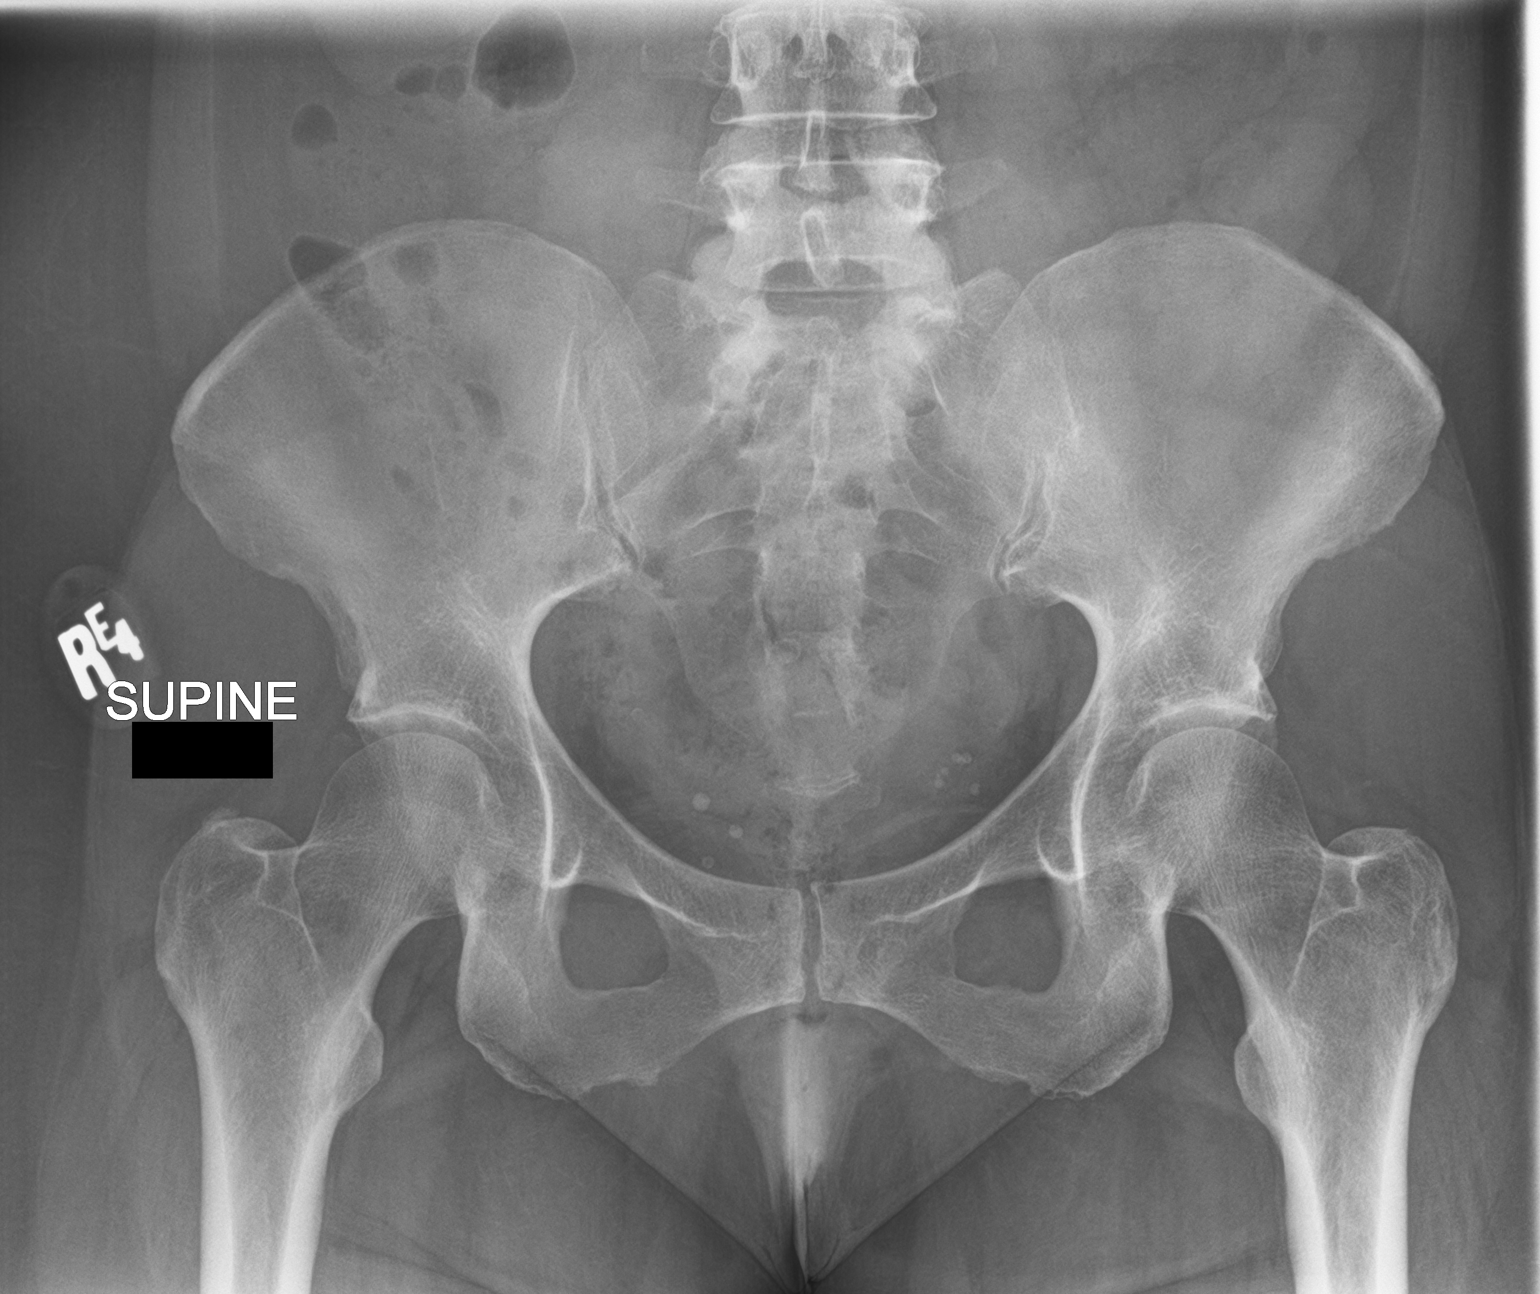

[2 of 2 positions shown; findings below may reference images not displayed]

FINDINGS: The bowel gas pattern is normal. No radio-opaque calculi or other
significant radiographic abnormality are seen. Pelvic phleboliths.
IMPRESSION: No definite renal or ureteral calculi visualized.

## 2020-05-26 MED ORDER — TAMSULOSIN HCL 0.4 MG PO CAPS: 0.4000 mg | ORAL_CAPSULE | Freq: Every day | ORAL | 0 refills | Status: DC

## 2020-05-26 NOTE — Patient Instructions (Signed)
Continue Flomax (I have refilled this for you today), pushing fluids, and straining your urine for fragments.  If you develop fever, chills, nausea, vomiting, or uncontrollable pain, please contact our office or go to the Emergency Department immediately.

## 2020-05-26 NOTE — Progress Notes (Signed)
05/26/2020 9:42 AM   Gloris Ham [facility] Aug 09, 1965 [id number]  CC: Chief Complaint  Patient presents with  . Nephrolithiasis   HPI: Carolyn Trevino is a 55 y.o. female s/p ESWL 14 days ago with Dr. Richardo Hanks for management of an 8 mm mid right ureteral calculus.  Intraoperative findings notable for apparent fragmentation of the stone.  Today she reports ongoing RLQ discomfort.  She has passed a minimal amount of stone fragments, which she brings today for analysis.  She denies dysuria, fever, chills, nausea, and vomiting.  She does report urinary urgency and frequency, ongoing since an episode of acute cystitis last year.  KUB today with possible retained mid right ureteral stone overlying the sacrum.  Notably, she underwent MRCP 6 days ago which revealed stable moderate right hydroureteronephrosis.  In-office UA today positive for 1+ blood, trace protein, and 2+ leukocyte esterase; urine microscopy with >30 WBCs/HPF and 3-10 RBCs/HPF.  PMH: No past medical history on file.  Surgical History: Past Surgical History:  Procedure Laterality Date  . ABLATION ON ENDOMETRIOSIS    . ANAL FISSURE REPAIR    . EXTRACORPOREAL SHOCK WAVE LITHOTRIPSY Right 05/12/2020   Procedure: EXTRACORPOREAL SHOCK WAVE LITHOTRIPSY (ESWL);  Surgeon: Sondra Come, MD;  Location: [facility] ORS;  Service: Urology;  Laterality: Right;  . EYE SURGERY    . WISDOM TOOTH EXTRACTION      Home Medications:  Allergies as of 05/26/2020      Reactions   Anesthetics, Amide Nausea And Vomiting   Hydrocodone Nausea Only   Propofol Nausea Only   Wasp Venom Swelling   Wasp Venom Protein    Sulfamethoxazole-trimethoprim Rash      Medication List       Accurate as of May 26, 2020  9:42 AM. If you have any questions, ask your nurse or doctor.        butalbital-acetaminophen-caffeine 50-325-40 MG tablet Commonly known as: FIORICET Take 1 tablet by mouth every 6 (six) hours as needed.   meloxicam 15 MG  tablet Commonly known as: MOBIC Take 15 mg by mouth daily.   Multi-Vitamin Gummies Chew   tamsulosin 0.4 MG Caps capsule Commonly known as: FLOMAX Take 1 capsule (0.4 mg total) by mouth daily after supper.   tiZANidine 4 MG tablet Commonly known as: ZANAFLEX tizanidine 4 mg tablet   Vitamin C Gummies 125 MG Chew Generic drug: Ascorbic Acid       Allergies:  Allergies  Allergen Reactions  . Anesthetics, Amide Nausea And Vomiting  . Hydrocodone Nausea Only  . Propofol Nausea Only  . Wasp Venom Swelling  . Wasp Venom Protein   . Sulfamethoxazole-Trimethoprim Rash    Family History: Family History  Problem Relation Age of Onset  . Breast cancer Mother 5  . Prostate cancer Father     Social History:   reports that she has never smoked. She has never used smokeless tobacco. No history on file for alcohol use and drug use.  Physical Exam: BP (!) 145/92   Pulse (!) 124   Ht 5' (1.524 m)   Wt 153 lb (69.4 kg)   BMI 29.88 kg/m   Constitutional:  Alert and oriented, no acute distress, nontoxic appearing HEENT: [place], AT Cardiovascular: No clubbing, cyanosis, or edema Respiratory: Normal respiratory effort, no increased work of breathing Skin: No rashes, bruises or suspicious lesions Neurologic: Grossly intact, no focal deficits, moving all 4 extremities Psychiatric: Normal mood and affect  Laboratory Data: Results for orders placed or performed  in visit on 05/26/20  CULTURE, URINE COMPREHENSIVE   Specimen: Urine   UR  Result Value Ref Range   Urine Culture, Comprehensive Final report (A)    Organism ID, Bacteria Escherichia coli (A)    Organism ID, Bacteria Comment    ANTIMICROBIAL SUSCEPTIBILITY Comment   Microscopic Examination   Urine  Result Value Ref Range   WBC, UA >30 (A) 0 - 5 /hpf   RBC 3-10 (A) 0 - 2 /hpf   Epithelial Cells (non renal) 0-10 0 - 10 /hpf   Renal Epithel, UA 0-10 (A) None seen /hpf   Casts Present (A) None seen /lpf   Cast Type  Hyaline casts N/A   Bacteria, UA Few None seen/Few  Urinalysis, Complete  Result Value Ref Range   Specific Gravity, UA 1.020 1.005 - 1.030   pH, UA 5.5 5.0 - 7.5   Color, UA Yellow Yellow   Appearance Ur Cloudy (A) Clear   Leukocytes,UA 2+ (A) Negative   Protein,UA Trace (A) Negative/Trace   Glucose, UA Negative Negative   Ketones, UA Negative Negative   RBC, UA 1+ (A) Negative   Bilirubin, UA Negative Negative   Urobilinogen, Ur 0.2 0.2 - 1.0 mg/dL   Nitrite, UA Negative Negative   Microscopic Examination See below:    Pertinent Imaging: KUB, 05/26/2020: CLINICAL DATA:  Nephrolithiasis status post ESWL  EXAM: ABDOMEN - 1 VIEW  COMPARISON:  Abdominal radiograph May 12, 2020  FINDINGS: The bowel gas pattern is normal. No radio-opaque calculi or other significant radiographic abnormality are seen. Pelvic phleboliths.  IMPRESSION: No definite renal or ureteral calculi visualized.   Electronically Signed   By: Maudry Mayhew MD   On: 05/27/2020 09:44  MRCP, 05/20/2020: CLINICAL DATA:  55 year old female with history of flank pain for 1 week.  EXAM: MRI ABDOMEN WITHOUT AND WITH CONTRAST (INCLUDING MRCP)  TECHNIQUE: Multiplanar multisequence MR imaging of the abdomen was performed both before and after the administration of intravenous contrast. Heavily T2-weighted images of the biliary and pancreatic ducts were obtained, and three-dimensional MRCP images were rendered by post processing.  CONTRAST:  33mL GADAVIST GADOBUTROL 1 MMOL/ML IV SOLN  COMPARISON:  No prior abdominal MRI. CT the abdomen and pelvis 05/04/2020.  FINDINGS: Lower chest: Unremarkable.  Hepatobiliary: Subcentimeter T1 hypointense, T2 hyperintense, nonenhancing lesions in the liver, compatible with tiny cysts and/or biliary hamartomas. No other suspicious hepatic lesions. No intra or extrahepatic biliary ductal dilatation. Gallbladder is normal in appearance.  Pancreas: In  the tail of the pancreas there is a well-defined T1 hypointense, T2 hyperintense, nonenhancing lesion measuring 1.4 x 1.0 cm (axial image 18 of series 4). This does not appear to communicate with the main pancreatic duct. No other pancreatic mass. No pancreatic ductal dilatation. No peripancreatic fluid collections or inflammatory changes.  Spleen:  Unremarkable.  Adrenals/Urinary Tract: Moderate right hydroureteronephrosis, similar to prior CT the abdomen and pelvis 05/04/2020. Left kidney and bilateral adrenal glands are normal in appearance. No left hydroureteronephrosis.  Stomach/Bowel: The appearance of the stomach and visualized portions of small bowel and colon is unremarkable.  Vascular/Lymphatic: No aneurysm identified in the visualized abdominal vasculature. No lymphadenopathy noted in the abdomen.  Other: No significant volume of ascites noted in the visualized portions of the peritoneal cavity.  Musculoskeletal: No aggressive appearing osseous lesions are noted in the visualized portions of the skeleton.  IMPRESSION: 1. 1.4 x 1.0 cm cystic lesion in the tail of the pancreas is favored to represent a small benign lesion  such as a pancreatic pseudocyst. Recommend follow up pre and post contrast MRI/MRCP or pancreatic protocol CT in 1 year. This recommendation follows ACR consensus guidelines: Management of Incidental Pancreatic Cysts: A White Paper of the ACR Incidental Findings Committee. J Am Coll Radiol 2017;14:911-923. 2. Moderate right hydroureteronephrosis, similar to prior CT the abdomen and pelvis 05/04/2020. 3. Additional incidental findings, as above.   Electronically Signed   By: Trudie Reed M.D.   On: 05/20/2020 11:53  I personally reviewed the images referenced above and note possible retained mid right ureteral stone overlying the sacrum on KUB and stable right hydroureteronephrosis on MRCP.  Assessment & Plan:   1. Right ureteral  stone Ongoing RLQ discomfort, minimal fragment passage, stable right hydroureteronephrosis on MRCP 6 days ago, and possible retained stone overlying the sacrum on KUB today.  I counseled her to continue Flomax an additional 2 weeks, push fluids, and strain her urine.  We will follow-up with UA and renal ultrasound in 2 weeks.  Patient with stable vitals and without infective symptoms today.  She completed culture appropriate antibiotics perioperatively and has fewer bacteria on microscopy than prior.  I believe for pyuria and microscopic hematuria are consistent with ongoing stone episode rather than infection and will defer antibiotics at this time, however will obtain culture in case she clinically worsens.  We reviewed return precautions including fever, chills, nausea, vomiting, and uncontrollable pain.  She expressed understanding. - Urinalysis, Complete - CULTURE, URINE COMPREHENSIVE - US RENAL; Future - tamsulosin (FLOMAX) 0.4 MG CAPS capsule; Take 1 capsule (0.4 mg total) by mouth daily after supper.  Dispense: 14 capsule; Refill: 0 - Calculi, with Photograph (to Clinical Lab) - Microscopic Examination   Return in about 2 weeks (around 06/09/2020) for Stone f/u with UA + RUS prior.  Carman Ching, PA-C  [facility] [address], [address] [place], [place] [address] [phone]

## 2020-05-29 LAB — CULTURE, URINE COMPREHENSIVE

## 2020-05-30 ENCOUNTER — Encounter
Admission: EM | Disposition: A | Discharge status: Home / Self Care | Payer: Self-pay | Source: Home / Self Care | Attending: Internal Medicine

## 2020-05-30 ENCOUNTER — Inpatient Hospital Stay: Payer: BC Managed Care – PPO

## 2020-05-30 ENCOUNTER — Inpatient Hospital Stay
Admission: EM | Admit: 2020-05-30 | Discharge: 2020-05-31 | DRG: 660 | Disposition: A | Discharge status: Home / Self Care | Payer: BC Managed Care – PPO | Attending: Family Medicine | Admitting: Family Medicine

## 2020-05-30 ENCOUNTER — Ambulatory Visit (INDEPENDENT_AMBULATORY_CARE_PROVIDER_SITE_OTHER): Payer: BC Managed Care – PPO | Admitting: Physician Assistant

## 2020-05-30 ENCOUNTER — Encounter: Payer: Self-pay | Admitting: Physician Assistant

## 2020-05-30 ENCOUNTER — Emergency Department: Payer: BC Managed Care – PPO | Admitting: Anesthesiology

## 2020-05-30 ENCOUNTER — Other Ambulatory Visit: Payer: Self-pay

## 2020-05-30 ENCOUNTER — Other Ambulatory Visit: Payer: Self-pay | Admitting: Physician Assistant

## 2020-05-30 ENCOUNTER — Encounter: Payer: Self-pay | Admitting: Emergency Medicine

## 2020-05-30 ENCOUNTER — Ambulatory Visit
Admission: RE | Admit: 2020-05-30 | Discharge: 2020-05-30 | Disposition: A | Discharge status: Home / Self Care | Payer: BC Managed Care – PPO | Source: Ambulatory Visit | Attending: Physician Assistant | Admitting: Physician Assistant

## 2020-05-30 ENCOUNTER — Telehealth: Payer: Self-pay | Admitting: Physician Assistant

## 2020-05-30 VITALS — BP 108/77 | HR 123 | Temp 98.0°F | Ht 60.0 in | Wt 154.0 lb

## 2020-05-30 DIAGNOSIS — N3000 Acute cystitis without hematuria: Secondary | ICD-10-CM

## 2020-05-30 DIAGNOSIS — I313 Pericardial effusion (noninflammatory): Secondary | ICD-10-CM | POA: Diagnosis present

## 2020-05-30 DIAGNOSIS — Z885 Allergy status to narcotic agent status: Secondary | ICD-10-CM | POA: Diagnosis not present

## 2020-05-30 DIAGNOSIS — Z79899 Other long term (current) drug therapy: Secondary | ICD-10-CM

## 2020-05-30 DIAGNOSIS — Z888 Allergy status to other drugs, medicaments and biological substances status: Secondary | ICD-10-CM

## 2020-05-30 DIAGNOSIS — Z882 Allergy status to sulfonamides status: Secondary | ICD-10-CM | POA: Diagnosis not present

## 2020-05-30 DIAGNOSIS — N131 Hydronephrosis with ureteral stricture, not elsewhere classified: Secondary | ICD-10-CM | POA: Diagnosis present

## 2020-05-30 DIAGNOSIS — N1330 Unspecified hydronephrosis: Secondary | ICD-10-CM | POA: Diagnosis not present

## 2020-05-30 DIAGNOSIS — Z20822 Contact with and (suspected) exposure to covid-19: Secondary | ICD-10-CM | POA: Diagnosis present

## 2020-05-30 DIAGNOSIS — B9620 Unspecified Escherichia coli [E. coli] as the cause of diseases classified elsewhere: Secondary | ICD-10-CM | POA: Diagnosis present

## 2020-05-30 DIAGNOSIS — Z8042 Family history of malignant neoplasm of prostate: Secondary | ICD-10-CM

## 2020-05-30 DIAGNOSIS — N201 Calculus of ureter: Secondary | ICD-10-CM

## 2020-05-30 DIAGNOSIS — Z803 Family history of malignant neoplasm of breast: Secondary | ICD-10-CM | POA: Diagnosis not present

## 2020-05-30 DIAGNOSIS — Z87442 Personal history of urinary calculi: Secondary | ICD-10-CM

## 2020-05-30 DIAGNOSIS — N179 Acute kidney failure, unspecified: Secondary | ICD-10-CM | POA: Diagnosis present

## 2020-05-30 DIAGNOSIS — N10 Acute tubulo-interstitial nephritis: Secondary | ICD-10-CM | POA: Diagnosis present

## 2020-05-30 DIAGNOSIS — Z791 Long term (current) use of non-steroidal anti-inflammatories (NSAID): Secondary | ICD-10-CM | POA: Diagnosis not present

## 2020-05-30 DIAGNOSIS — N136 Pyonephrosis: Secondary | ICD-10-CM | POA: Diagnosis present

## 2020-05-30 DIAGNOSIS — I3139 Other pericardial effusion (noninflammatory): Secondary | ICD-10-CM | POA: Diagnosis present

## 2020-05-30 DIAGNOSIS — N200 Calculus of kidney: Secondary | ICD-10-CM

## 2020-05-30 HISTORY — PX: CYSTOSCOPY WITH STENT PLACEMENT: SHX5790

## 2020-05-30 HISTORY — DX: Other complications of anesthesia, initial encounter: T88.59XA

## 2020-05-30 HISTORY — DX: Disease of pancreas, unspecified: K86.9

## 2020-05-30 HISTORY — DX: Unspecified lump in the left breast, unspecified quadrant: N63.20

## 2020-05-30 LAB — COMPREHENSIVE METABOLIC PANEL
ALT: 20 U/L (ref 0–44)
AST: 21 U/L (ref 15–41)
Albumin: 3.9 g/dL (ref 3.5–5.0)
Alkaline Phosphatase: 101 U/L (ref 38–126)
Anion gap: 11 (ref 5–15)
BUN: 14 mg/dL (ref 6–20)
CO2: 24 mmol/L (ref 22–32)
Calcium: 9.4 mg/dL (ref 8.9–10.3)
Chloride: 102 mmol/L (ref 98–111)
Creatinine, Ser: 1.2 mg/dL — ABNORMAL HIGH (ref 0.44–1.00)
GFR, Estimated: 53 mL/min — ABNORMAL LOW (ref >60)
Glucose, Bld: 103 mg/dL — ABNORMAL HIGH (ref 70–99)
Potassium: 3.7 mmol/L (ref 3.5–5.1)
Sodium: 137 mmol/L (ref 135–145)
Total Bilirubin: 0.8 mg/dL (ref 0.3–1.2)
Total Protein: 8.1 g/dL (ref 6.5–8.1)

## 2020-05-30 LAB — CBC WITH DIFFERENTIAL/PLATELET
Abs Immature Granulocytes: 0.03 10*3/uL (ref 0.00–0.07)
Basophils Absolute: 0 10*3/uL (ref 0.0–0.1)
Basophils Relative: 0 %
Eosinophils Absolute: 0 10*3/uL (ref 0.0–0.5)
Eosinophils Relative: 0 %
HCT: 35.8 % — ABNORMAL LOW (ref 36.0–46.0)
Hemoglobin: 11.6 g/dL — ABNORMAL LOW (ref 12.0–15.0)
Immature Granulocytes: 0 %
Lymphocytes Relative: 13 %
Lymphs Abs: 1.1 10*3/uL (ref 0.7–4.0)
MCH: 27.7 pg (ref 26.0–34.0)
MCHC: 32.4 g/dL (ref 30.0–36.0)
MCV: 85.4 fL (ref 80.0–100.0)
Monocytes Absolute: 0.9 10*3/uL (ref 0.1–1.0)
Monocytes Relative: 10 %
Neutro Abs: 6.8 10*3/uL (ref 1.7–7.7)
Neutrophils Relative %: 77 %
Platelets: 335 10*3/uL (ref 150–400)
RBC: 4.19 MIL/uL (ref 3.87–5.11)
RDW: 13.2 % (ref 11.5–15.5)
WBC: 8.8 10*3/uL (ref 4.0–10.5)
nRBC: 0 % (ref 0.0–0.2)

## 2020-05-30 LAB — RESP PANEL BY RT-PCR (FLU A&B, COVID) ARPGX2
Influenza A by PCR: NEGATIVE
Influenza B by PCR: NEGATIVE
SARS Coronavirus 2 by RT PCR: NEGATIVE

## 2020-05-30 LAB — BRAIN NATRIURETIC PEPTIDE: B Natriuretic Peptide: 27.6 pg/mL (ref 0.0–100.0)

## 2020-05-30 LAB — TROPONIN I (HIGH SENSITIVITY): Troponin I (High Sensitivity): 5 ng/L (ref <18)

## 2020-05-30 LAB — LACTIC ACID, PLASMA: Lactic Acid, Venous: 1 mmol/L (ref 0.5–1.9)

## 2020-05-30 LAB — HIV ANTIBODY (ROUTINE TESTING W REFLEX): HIV Screen 4th Generation wRfx: NONREACTIVE

## 2020-05-30 IMAGING — CT CT RENAL STONE PROTOCOL
2 of 4 series · 15 of 46 positions shown, 17 images · non-contrast
Comparison: [DATE].
COMPARISON: [DATE].

Addendum:
CLINICAL DATA: Right lower quadrant pain, flank pain, lithotripsy 2
weeks ago. Increased urinary frequency.

EXAM:
CT ABDOMEN AND PELVIS WITHOUT CONTRAST
TECHNIQUE: Multidetector CT imaging of the abdomen and pelvis was performed
following the standard protocol without IV contrast.

[Series 2: stone full standard · axial · 0.71mm/px · z∈[-497,-107]mm · 12 of 90 slices shown, 14 images]
[im 8/90  soft-tissue]
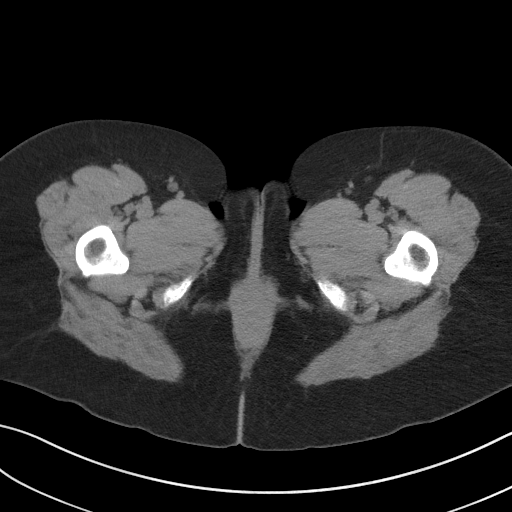
[im 8/90  bone]
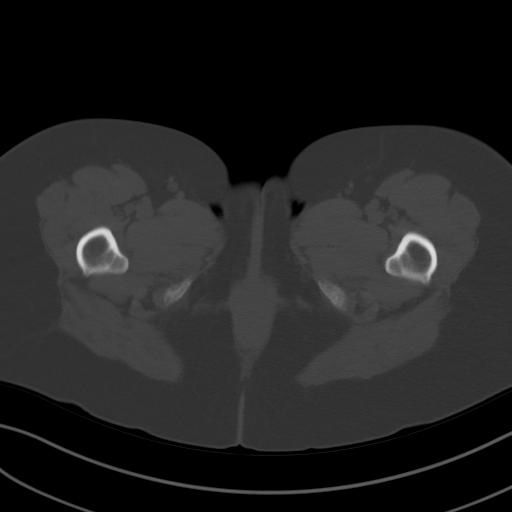
[im 15/90  soft-tissue]
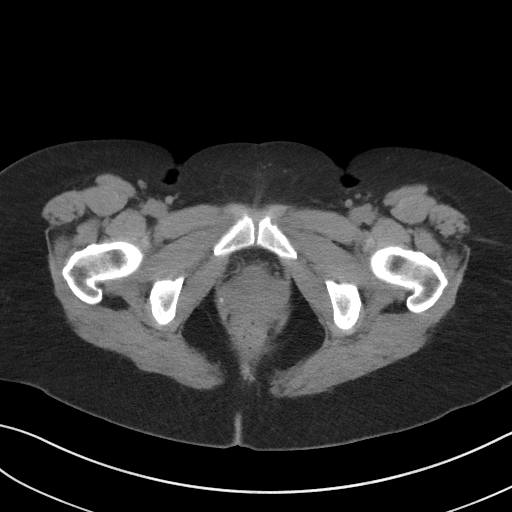
[im 22/90  soft-tissue]
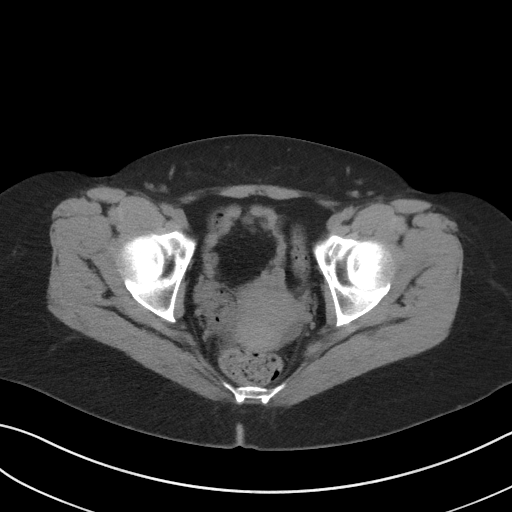
[im 29/90  soft-tissue]
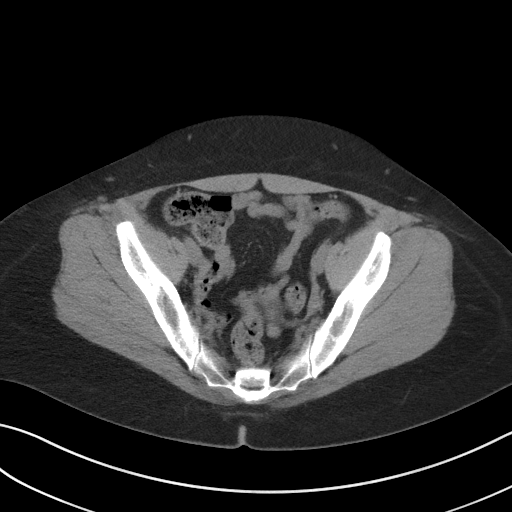
[im 36/90  soft-tissue]
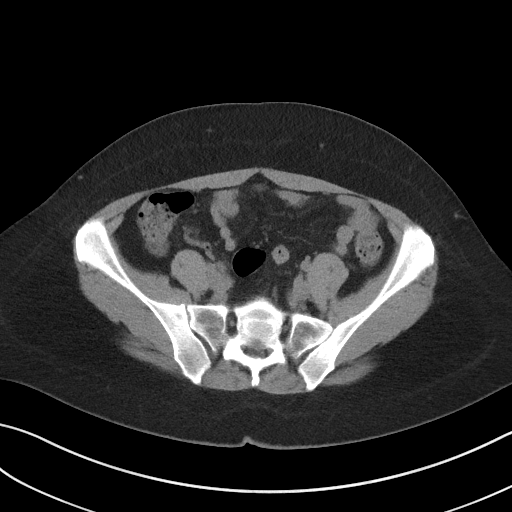
[im 43/90  soft-tissue]
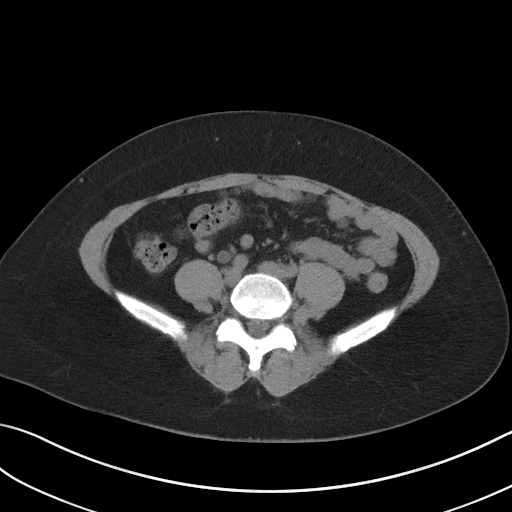
[im 50/90  soft-tissue]
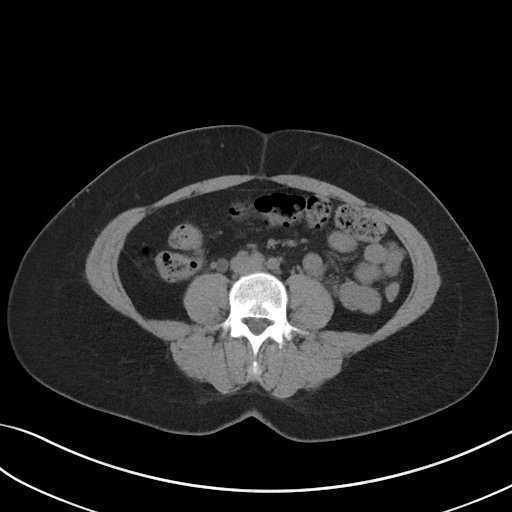
[im 57/90  soft-tissue]
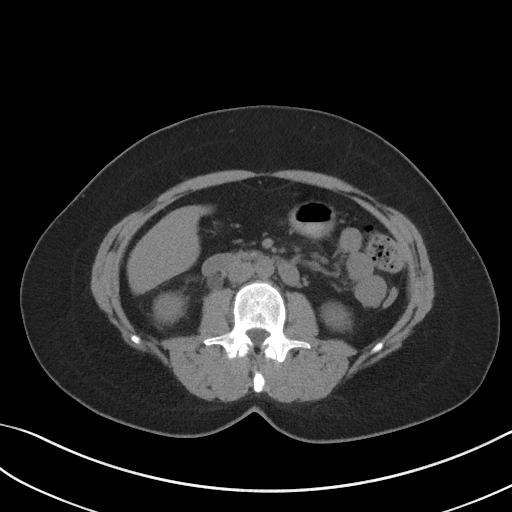
[im 65/90  soft-tissue]
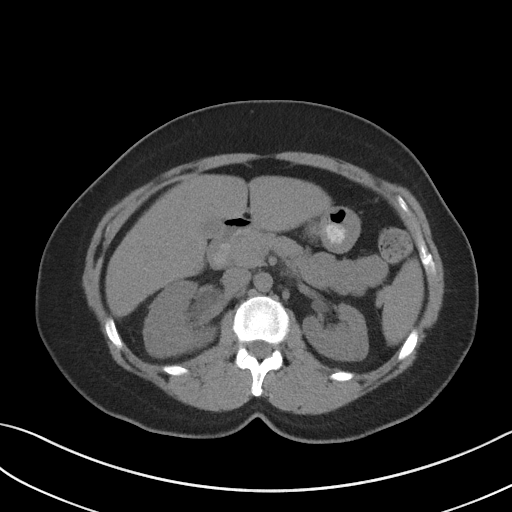
[im 65/90  bone]
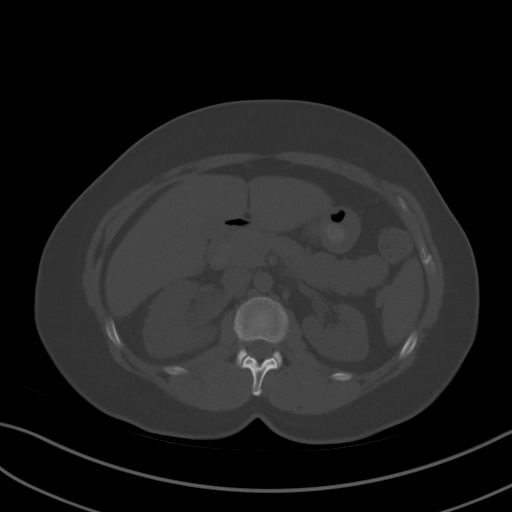
[im 72/90  soft-tissue]
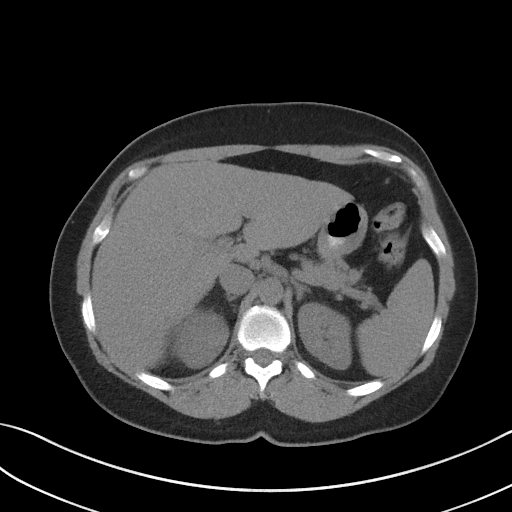
[im 79/90  soft-tissue]
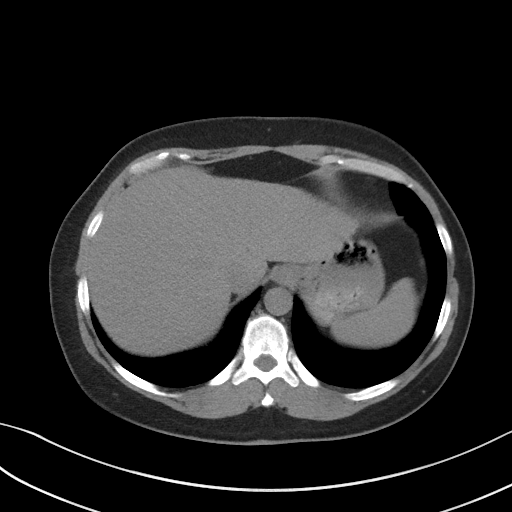
[im 86/90  soft-tissue]
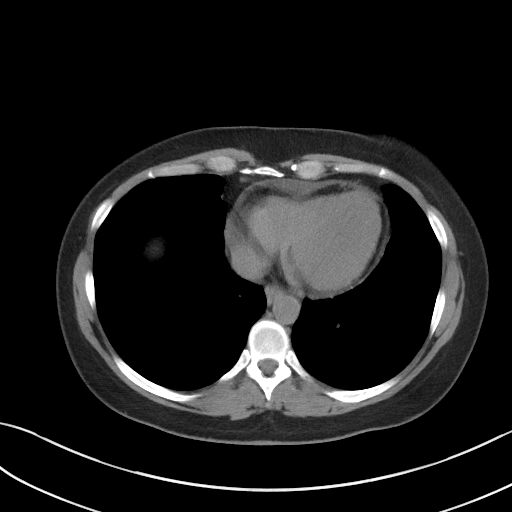

[Series 5: coronal · coronal · 0.79mm/px · 3 of 117 slices shown]
[im 39/117  soft-tissue]
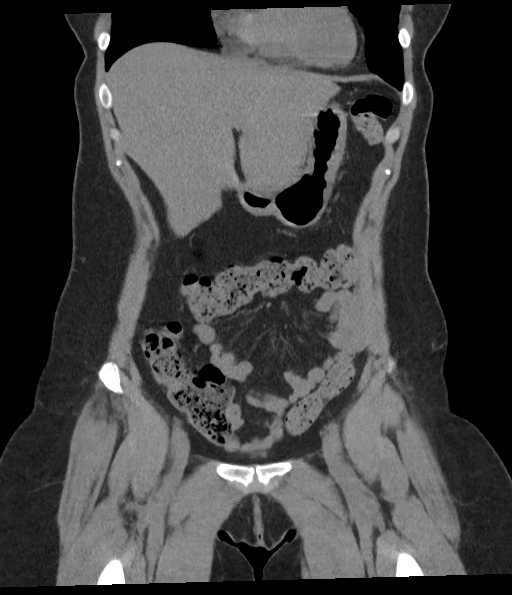
[im 52/117  soft-tissue]
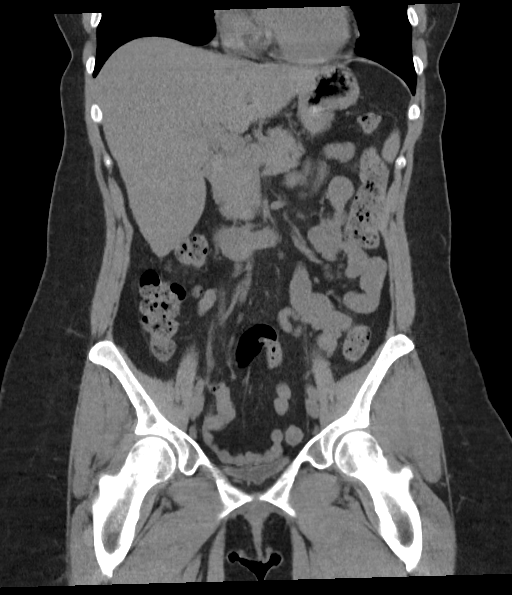
[im 65/117  soft-tissue]
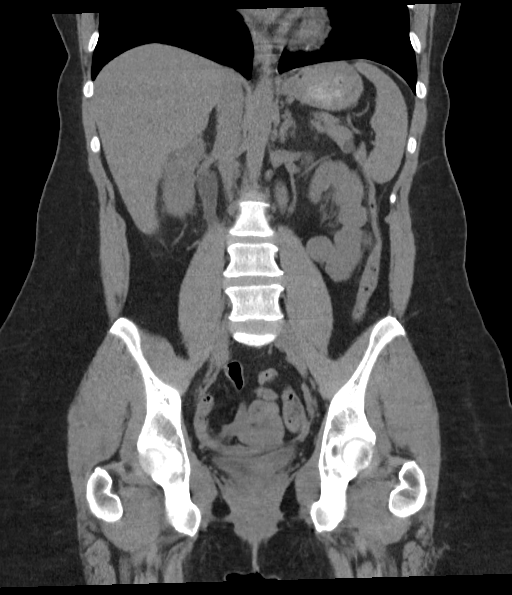

[15 of 46 positions shown; findings below may reference images not displayed]

FINDINGS: Lower chest: 1 mm peripheral left lower lobe nodule, likely benign.
Heart size normal. New small pericardial effusion. No pleural
effusion. Distal esophagus is unremarkable.

Hepatobiliary: Liver and gallbladder are unremarkable. No biliary
ductal dilatation.

Pancreas: Negative.

Spleen: Negative.

Adrenals/Urinary Tract: Adrenal glands are unremarkable. Right renal
edema with severe right hydronephrosis secondary to a 6 mm stone in
the distal right ureter (2/53 and coronal image 56), similar to
[DATE]. There may be a punctate stone slightly more superiorly
within right ureter (2/51). Distal right ureter is otherwise
decompressed. Left kidney is unremarkable. Left ureter is
decompressed. Bladder is low in volume.

Stomach/Bowel: Stomach, small bowel, appendix and colon are
unremarkable.

Vascular/Lymphatic: Vascular structures are unremarkable. No
pathologically enlarged lymph nodes.

Reproductive: Uterus is visualized.  No adnexal mass.

Other: No free fluid.  Mesenteries and peritoneum are unremarkable.

Musculoskeletal: Minimal degenerative change in the spine.
IMPRESSION: Severe right hydronephrosis secondary to stones measuring up to 6 mm
in the distal right ureter. Hydronephrosis has increased in the
interval with the largest stone being in similar position to
[DATE].

ADDENDUM:
The following was included in the body of the original report, but
was not stated specifically in the impression section:

New small pericardial effusion.

*** End of Addendum ***
FINDINGS: Lower chest: 1 mm peripheral left lower lobe nodule, likely benign.
Heart size normal. New small pericardial effusion. No pleural
effusion. Distal esophagus is unremarkable.

Hepatobiliary: Liver and gallbladder are unremarkable. No biliary
ductal dilatation.

Pancreas: Negative.

Spleen: Negative.

Adrenals/Urinary Tract: Adrenal glands are unremarkable. Right renal
edema with severe right hydronephrosis secondary to a 6 mm stone in
the distal right ureter (2/53 and coronal image 56), similar to
[DATE]. There may be a punctate stone slightly more superiorly
within right ureter (2/51). Distal right ureter is otherwise
decompressed. Left kidney is unremarkable. Left ureter is
decompressed. Bladder is low in volume.

Stomach/Bowel: Stomach, small bowel, appendix and colon are
unremarkable.

Vascular/Lymphatic: Vascular structures are unremarkable. No
pathologically enlarged lymph nodes.

Reproductive: Uterus is visualized.  No adnexal mass.

Other: No free fluid.  Mesenteries and peritoneum are unremarkable.

Musculoskeletal: Minimal degenerative change in the spine.
IMPRESSION: Severe right hydronephrosis secondary to stones measuring up to 6 mm
in the distal right ureter. Hydronephrosis has increased in the
interval with the largest stone being in similar position to
[DATE].

## 2020-05-30 SURGERY — CYSTOSCOPY, WITH STENT INSERTION
Anesthesia: General | Laterality: Right

## 2020-05-30 MED ORDER — IOHEXOL 180 MG/ML  SOLN
INTRAMUSCULAR | Status: DC | PRN
  Administered 2020-05-30: 10 mL

## 2020-05-30 MED ORDER — LACTATED RINGERS IV BOLUS: 1000.0000 mL | Freq: Once | INTRAVENOUS | Status: DC

## 2020-05-30 MED ORDER — FAMOTIDINE 20 MG PO TABS
ORAL_TABLET | ORAL | Status: AC
  Administered 2020-05-30: 20 mg
  Filled 2020-05-30: qty 1

## 2020-05-30 MED ORDER — DEXAMETHASONE SODIUM PHOSPHATE 10 MG/ML IJ SOLN
INTRAMUSCULAR | Status: DC | PRN
  Administered 2020-05-30: 10 mg via INTRAVENOUS

## 2020-05-30 MED ORDER — FENTANYL CITRATE (PF) 100 MCG/2ML IJ SOLN
INTRAMUSCULAR | Status: AC
  Filled 2020-05-30: qty 2

## 2020-05-30 MED ORDER — SODIUM CHLORIDE 0.9 % IV SOLN
1.0000 g | INTRAVENOUS | Status: DC
  Administered 2020-05-30: 1 g via INTRAVENOUS
  Filled 2020-05-30: qty 1
  Filled 2020-05-30: qty 10

## 2020-05-30 MED ORDER — FENTANYL CITRATE (PF) 100 MCG/2ML IJ SOLN
INTRAMUSCULAR | Status: DC | PRN
  Administered 2020-05-30: 25 ug via INTRAVENOUS

## 2020-05-30 MED ORDER — MIDAZOLAM HCL 2 MG/2ML IJ SOLN
INTRAMUSCULAR | Status: AC
  Filled 2020-05-30: qty 2

## 2020-05-30 MED ORDER — TAMSULOSIN HCL 0.4 MG PO CAPS
0.4000 mg | ORAL_CAPSULE | Freq: Every day | ORAL | Status: DC
  Administered 2020-05-30: 0.4 mg via ORAL
  Filled 2020-05-30 (×2): qty 1

## 2020-05-30 MED ORDER — CHLORHEXIDINE GLUCONATE 0.12 % MT SOLN: 15.0000 mL | Freq: Once | OROMUCOSAL | Status: AC

## 2020-05-30 MED ORDER — OXYCODONE HCL 5 MG PO TABS: 5.0000 mg | ORAL_TABLET | Freq: Once | ORAL | Status: DC | PRN

## 2020-05-30 MED ORDER — CHLORHEXIDINE GLUCONATE 0.12 % MT SOLN
OROMUCOSAL | Status: AC
  Administered 2020-05-30: 15 mL via OROMUCOSAL
  Filled 2020-05-30: qty 15

## 2020-05-30 MED ORDER — SUCCINYLCHOLINE CHLORIDE 20 MG/ML IJ SOLN
INTRAMUSCULAR | Status: DC | PRN
  Administered 2020-05-30: 80 mg via INTRAVENOUS

## 2020-05-30 MED ORDER — ORAL CARE MOUTH RINSE: 15.0000 mL | Freq: Once | OROMUCOSAL | Status: AC

## 2020-05-30 MED ORDER — LACTATED RINGERS IV SOLN: INTRAVENOUS | Status: DC

## 2020-05-30 MED ORDER — ACETAMINOPHEN 325 MG PO TABS: 650.0000 mg | ORAL_TABLET | Freq: Four times a day (QID) | ORAL | Status: DC | PRN

## 2020-05-30 MED ORDER — TIZANIDINE HCL 4 MG PO TABS
4.0000 mg | ORAL_TABLET | Freq: Three times a day (TID) | ORAL | Status: DC | PRN
  Filled 2020-05-30: qty 1

## 2020-05-30 MED ORDER — SCOPOLAMINE 1 MG/3DAYS TD PT72
MEDICATED_PATCH | TRANSDERMAL | Status: AC
  Filled 2020-05-30: qty 1

## 2020-05-30 MED ORDER — MORPHINE SULFATE (PF) 2 MG/ML IV SOLN: 2.0000 mg | INTRAVENOUS | Status: DC | PRN

## 2020-05-30 MED ORDER — PROPOFOL 500 MG/50ML IV EMUL
INTRAVENOUS | Status: AC
  Filled 2020-05-30: qty 50

## 2020-05-30 MED ORDER — LIDOCAINE HCL (CARDIAC) PF 100 MG/5ML IV SOSY
PREFILLED_SYRINGE | INTRAVENOUS | Status: DC | PRN
  Administered 2020-05-30: 60 mg via INTRAVENOUS

## 2020-05-30 MED ORDER — ADULT MULTIVITAMIN W/MINERALS CH
1.0000 | ORAL_TABLET | Freq: Every day | ORAL | Status: DC
  Administered 2020-05-30: 1 via ORAL
  Filled 2020-05-30 (×2): qty 1

## 2020-05-30 MED ORDER — CHLORHEXIDINE GLUCONATE 0.12 % MT SOLN: 15.0000 mL | Freq: Once | OROMUCOSAL | Status: DC

## 2020-05-30 MED ORDER — PROMETHAZINE HCL 25 MG/ML IJ SOLN: 6.2500 mg | INTRAMUSCULAR | Status: DC | PRN

## 2020-05-30 MED ORDER — ORAL CARE MOUTH RINSE: 15.0000 mL | Freq: Once | OROMUCOSAL | Status: DC

## 2020-05-30 MED ORDER — PROPOFOL 10 MG/ML IV BOLUS
INTRAVENOUS | Status: DC | PRN
  Administered 2020-05-30: 150 mg via INTRAVENOUS

## 2020-05-30 MED ORDER — BUTALBITAL-APAP-CAFFEINE 50-325-40 MG PO TABS
1.0000 | ORAL_TABLET | Freq: Four times a day (QID) | ORAL | Status: DC | PRN
  Filled 2020-05-30: qty 1

## 2020-05-30 MED ORDER — SODIUM CHLORIDE 0.9 % IV BOLUS: 1000.0000 mL | Freq: Once | INTRAVENOUS | Status: DC

## 2020-05-30 MED ORDER — LEVOFLOXACIN IN D5W 500 MG/100ML IV SOLN
INTRAVENOUS | Status: AC
  Filled 2020-05-30: qty 100

## 2020-05-30 MED ORDER — FENTANYL CITRATE (PF) 100 MCG/2ML IJ SOLN: 25.0000 ug | INTRAMUSCULAR | Status: DC | PRN

## 2020-05-30 MED ORDER — MIDAZOLAM HCL 2 MG/2ML IJ SOLN
INTRAMUSCULAR | Status: DC | PRN
  Administered 2020-05-30: 2 mg via INTRAVENOUS

## 2020-05-30 MED ORDER — LEVOFLOXACIN IN D5W 500 MG/100ML IV SOLN
INTRAVENOUS | Status: DC | PRN
  Administered 2020-05-30: 500 mg via INTRAVENOUS

## 2020-05-30 MED ORDER — ONDANSETRON HCL 4 MG/2ML IJ SOLN: 4.0000 mg | Freq: Three times a day (TID) | INTRAMUSCULAR | Status: DC | PRN

## 2020-05-30 MED ORDER — PROPOFOL 10 MG/ML IV BOLUS
INTRAVENOUS | Status: AC
  Filled 2020-05-30: qty 20

## 2020-05-30 MED ORDER — OXYCODONE HCL 5 MG/5ML PO SOLN: 5.0000 mg | Freq: Once | ORAL | Status: DC | PRN

## 2020-05-30 MED ORDER — ONDANSETRON HCL 4 MG/2ML IJ SOLN
INTRAMUSCULAR | Status: DC | PRN
  Administered 2020-05-30: 4 mg via INTRAVENOUS

## 2020-05-30 SURGICAL SUPPLY — 21 items
BAG DRAIN CYSTO-URO LG1000N (MISCELLANEOUS) ×2 IMPLANT
BRUSH SCRUB EZ 1% IODOPHOR (MISCELLANEOUS) ×2 IMPLANT
CATH URETL 5X70 OPEN END (CATHETERS) ×1 IMPLANT
GLOVE SURG ENC MOIS LTX SZ6.5 (GLOVE) ×2 IMPLANT
GOWN STRL REUS W/ TWL LRG LVL3 (GOWN DISPOSABLE) ×2 IMPLANT
GOWN STRL REUS W/TWL LRG LVL3 (GOWN DISPOSABLE) ×4
GUIDEWIRE STR DUAL SENSOR (WIRE) ×2 IMPLANT
KIT TURNOVER CYSTO (KITS) ×2 IMPLANT
MANIFOLD NEPTUNE II (INSTRUMENTS) ×1 IMPLANT
PACK CYSTO AR (MISCELLANEOUS) ×2 IMPLANT
SET CYSTO W/LG BORE CLAMP LF (SET/KITS/TRAYS/PACK) ×2 IMPLANT
SOL .9 NS 3000ML IRR  AL (IV SOLUTION) ×2
SOL .9 NS 3000ML IRR AL (IV SOLUTION) ×1
SOL .9 NS 3000ML IRR UROMATIC (IV SOLUTION) ×1 IMPLANT
STENT URET 6FRX22 CONTOUR (STENTS) ×1 IMPLANT
STENT URET 6FRX24 CONTOUR (STENTS) IMPLANT
STENT URET 6FRX26 CONTOUR (STENTS) IMPLANT
SURGILUBE 2OZ TUBE FLIPTOP (MISCELLANEOUS) ×2 IMPLANT
SYR TOOMEY IRRIG 70ML (MISCELLANEOUS) ×2
SYRINGE TOOMEY IRRIG 70ML (MISCELLANEOUS) ×1 IMPLANT
WATER STERILE IRR 1000ML POUR (IV SOLUTION) ×2 IMPLANT

## 2020-05-30 NOTE — H&P (View-Only) (Signed)
I have seen and examined the patient, labs/ imaging reviewed and discussed  management with Carolyn Trevino. I reviewed the PA's note and agree with the documented findings and plan of care.  Tachycardic with infected appearing urine, worsening right hydronephrosis secondary to obstructing stone status post failed ESWL.  Has been having fevers at home to 101.  Notably, she was seen last week in clinic at which time she grew E. coli in her urine, pansensitive.  Left of the same source.  In the setting of the above clinical picture, recommended urgent ureteral stent placement.  She was last n.p.o. this morning has not eaten or drank in 6+ hours.  Labs are still pending.  She also has a new pericardial effusion which be worked up by hospitalist service will be admitting the patient.  Carolyn Erby, MD      05/30/2020 10:44 AM   Carolyn Trevino 02/06/1966 [id number]  CC:    Chief Complaint  Patient presents with  . Other    HPI: Carolyn Trevino is a 54 y.o. female s/p ESWL 18 days ago with Dr. Sninsky for management of an 8 mm mid right ureteral calculus who presents today for evaluation of fever and flank pain.  I saw her in clinic most recently 4 days ago for postop follow-up.  She had not yet passed all of her stone at that time and UA was consistent with ongoing stone episode versus UTI, however patient was asymptomatic and vitals are stable at that time.  Since that visit, her urine culture has resulted with pansensitive E. coli and she contacted clinic this morning to report a fever at home.  Today she reports chills and anorexia yesterday with poor sleep overnight.  She awoke this morning with subjective fever.  She took her temperature several times at home, T-max 101 F.  She took 200 mg of ibuprofen this morning around 5:30 AM and is now afebrile in clinic.  She had breakfast this morning around 8:30 AM and is now NPO.  She denies dysuria, urgency, frequency,  nausea, or vomiting.  She underwent stat CT stone study today which revealed worsening right hydronephrosis secondary to a 6 mm stone in the distal right ureter as well as a punctate more proximal right ureteral stone.  No perinephric stranding noted.  Additionally, she has a new small pericardial effusion.  In-office UA today positive for trace intact blood and 2+ leukocyte esterase; urine microscopy with >30 WBCs/HPF.  PMH: History reviewed. No pertinent past medical history.  Surgical History:      Past Surgical History:  Procedure Laterality Date  . ABLATION ON ENDOMETRIOSIS    . ANAL FISSURE REPAIR    . EXTRACORPOREAL SHOCK WAVE LITHOTRIPSY Right 05/12/2020   Procedure: EXTRACORPOREAL SHOCK WAVE LITHOTRIPSY (ESWL);  Surgeon: Sninsky, Brian C, MD;  Location: [facility] ORS;  Service: Urology;  Laterality: Right;  . EYE SURGERY    . WISDOM TOOTH EXTRACTION      Home Medications:       Allergies as of 05/30/2020      Reactions   Anesthetics, Amide Nausea And Vomiting   Hydrocodone Nausea Only   Propofol Nausea Only   Wasp Venom Swelling   Wasp Venom Protein    Sulfamethoxazole-trimethoprim Rash         Medication List       Accurate as of May 30, 2020 10:44 AM. If you have any questions, ask your nurse or doctor.          butalbital-acetaminophen-caffeine 50-325-40 MG tablet Commonly known as: FIORICET Take 1 tablet by mouth every 6 (six) hours as needed.   meloxicam 15 MG tablet Commonly known as: MOBIC Take 15 mg by mouth daily.   Multi-Vitamin Gummies Chew   tamsulosin 0.4 MG Caps capsule Commonly known as: FLOMAX Take 1 capsule (0.4 mg total) by mouth daily after supper.   tiZANidine 4 MG tablet Commonly known as: ZANAFLEX tizanidine 4 mg tablet   Vitamin C Gummies 125 MG Chew Generic drug: Ascorbic Acid       Allergies:      Allergies  Allergen Reactions  . Anesthetics, Amide Nausea And Vomiting  .  Hydrocodone Nausea Only  . Propofol Nausea Only  . Wasp Venom Swelling  . Wasp Venom Protein   . Sulfamethoxazole-Trimethoprim Rash    Family History:      Family History  Problem Relation Age of Onset  . Breast cancer Mother 8  . Prostate cancer Father     Social History:   reports that she has never smoked. She has never used smokeless tobacco. No history on file for alcohol use and drug use.  Physical Exam: BP 108/77   Pulse (!) 123   Temp 98 F (36.7 C) (Oral)   Ht 5' (1.524 m)   Wt 154 lb (69.9 kg)   BMI 30.08 kg/m   Constitutional:  Alert and oriented, no acute distress, nontoxic appearing HEENT: Calcium, AT Cardiovascular: No clubbing, cyanosis, or edema Respiratory: Normal respiratory effort, no increased work of breathing Skin: No rashes, bruises or suspicious lesions Neurologic: Grossly intact, no focal deficits, moving all 4 extremities Psychiatric: Normal mood and affect  Laboratory Data: See Epic.  Pertinent Imaging: CT stone study, 05/30/2020: CLINICAL DATA: Right lower quadrant pain, flank pain, lithotripsy 2 weeks ago. Increased urinary frequency.  EXAM: CT ABDOMEN AND PELVIS WITHOUT CONTRAST  TECHNIQUE: Multidetector CT imaging of the abdomen and pelvis was performed following the standard protocol without IV contrast.  COMPARISON: 05/04/2020.  FINDINGS: Lower chest: 1 mm peripheral left lower lobe nodule, likely benign. Heart size normal. New small pericardial effusion. No pleural effusion. Distal esophagus is unremarkable.  Hepatobiliary: Liver and gallbladder are unremarkable. No biliary ductal dilatation.  Pancreas: Negative.  Spleen: Negative.  Adrenals/Urinary Tract: Adrenal glands are unremarkable. Right renal edema with severe right hydronephrosis secondary to a 6 mm stone in the distal right ureter (2/53 and coronal image 56), similar to 05/04/2020. There may be a punctate stone slightly more superiorly within  right ureter (2/51). Distal right ureter is otherwise decompressed. Left kidney is unremarkable. Left ureter is decompressed. Bladder is low in volume.  Stomach/Bowel: Stomach, small bowel, appendix and colon are unremarkable.  Vascular/Lymphatic: Vascular structures are unremarkable. No pathologically enlarged lymph nodes.  Reproductive: Uterus is visualized. No adnexal mass.  Other: No free fluid. Mesenteries and peritoneum are unremarkable.  Musculoskeletal: Minimal degenerative change in the spine.  IMPRESSION: Severe right hydronephrosis secondary to stones measuring up to 6 mm in the distal right ureter. Hydronephrosis has increased in the interval with the largest stone being in similar position to 05/04/2020.  Electronically Signed: By: Leanna Battles M.D. On: 05/30/2020 10:19  ADDENDUM REPORT: 05/30/2020 10:45  ADDENDUM: The following was included in the body of the original report, but was not stated specifically in the impression section:  New small pericardial effusion.   Electronically Signed By: Leanna Battles M.D. On: 05/30/2020 10:45  I personally reviewed the images referenced above and note right hydronephrosis to the level  of a 6 mm and right ureteral stone with a more proximal punctate fragment.  Assessment & Plan:   1. Right ureteral stone 55 year old female s/p ESWL for management of a mid right ureteral stone 18 days ago who now presents with fever in the setting of a retained 6 mm mid right ureteral fragment.  She is afebrile today after taking antipyretics at home, tachycardic, and normotensive.  CT findings notable for new small pericardial effusion.  I explained to the patient that I am concerned for infected retained fragment requiring further intervention in the form of urgent ureteral stenting today.  I explained that she may be admitted for IV antibiotics and that we would plan for definitive stone management at a  later date.  In consultation with Dr. Apolinar Junes, I counseled her to proceed to the emergency department for further evaluation, especially given her findings of pericardial effusion on imaging today.  I contacted the ED to inform them of her impending arrival.  Patient is in agreement with this plan.  I counseled the patient to remain n.p.o.  Notably, she has not received antibiotics for pansensitive E. coli in her urine.  I will defer to ED staff for administration of IV antibiotics in this case - Urinalysis, Complete - CULTURE, URINE COMPREHENSIVE   Return for Proceed to ED for further evaluation.  Carman Ching, PA-C  [facility] [address] [place], [place] [address] [phone]           Electronically signed by Carman Ching, PA-C at 05/30/2020 11:36 AM

## 2020-05-30 NOTE — ED Notes (Signed)
Patient states that she was sent to go for surgery to get a catheter for a kidney stone. States she was told she would need a covid test. Covid test sent with temp label so lab can begin to process.

## 2020-05-30 NOTE — ED Provider Notes (Signed)
[facility] Emergency Department Provider Note   ____________________________________________   Event Date/Time   First MD Initiated Contact with Patient 05/30/20 1315     (approximate)  I have reviewed the triage vital signs and the nursing notes.   HISTORY  Chief Complaint Nephrolithiasis    HPI Carolyn Trevino is a 55 y.o. female who presents from urology clinic with a diagnosed kidney stone with hydronephrosis and likely infection.  Patient was told to present to the emergency department for admission to the medical service given incidental findings of pericardial effusion on imaging today.  Patient also states that she is scheduled to receive a stent from Dr. Apolinar Junes today.  Patient denies any complaints at this time and states that she only has approximately 2/10 flank pain at this time.  Patient currently denies any vision changes, tinnitus, difficulty speaking, facial droop, sore throat, chest pain, shortness of breath, abdominal pain, nausea/vomiting/diarrhea, dysuria, or weakness/numbness/paresthesias in any extremity         History reviewed. No pertinent past medical history.  There are no problems to display for this patient.   Past Surgical History:  Procedure Laterality Date  . ABLATION ON ENDOMETRIOSIS    . ANAL FISSURE REPAIR    . EXTRACORPOREAL SHOCK WAVE LITHOTRIPSY Right 05/12/2020   Procedure: EXTRACORPOREAL SHOCK WAVE LITHOTRIPSY (ESWL);  Surgeon: Sondra Come, MD;  Location: [facility] ORS;  Service: Urology;  Laterality: Right;  . EYE SURGERY    . WISDOM TOOTH EXTRACTION      Prior to Admission medications   Medication Sig Start Date End Date Taking? Authorizing Provider  Ascorbic Acid (VITAMIN C GUMMIES) 125 MG CHEW     [provider]  butalbital-acetaminophen-caffeine (FIORICET) 50-325-40 MG tablet Take 1 tablet by mouth every 6 (six) hours as needed. 04/14/20   [provider]  meloxicam (MOBIC) 15 MG  tablet Take 15 mg by mouth daily. 02/26/20   [provider]  Multiple Vitamins-Minerals (MULTI-VITAMIN GUMMIES) CHEW     [provider]  tamsulosin (FLOMAX) 0.4 MG CAPS capsule Take 1 capsule (0.4 mg total) by mouth daily after supper. 05/26/20   Vaillancourt, Lelon Mast, PA-C  tiZANidine (ZANAFLEX) 4 MG tablet tizanidine 4 mg tablet    [provider]    Allergies Anesthetics, amide; Hydrocodone; Propofol; Wasp venom; Wasp venom protein; and Sulfamethoxazole-trimethoprim  Family History  Problem Relation Age of Onset  . Breast cancer Mother 42  . Prostate cancer Father     Social History Social History   Tobacco Use  . Smoking status: Never Smoker  . Smokeless tobacco: Never Used  Vaping Use  . Vaping Use: Never used    Review of Systems Constitutional: No fever/chills Eyes: No visual changes. ENT: No sore throat. Cardiovascular: Denies chest pain. Respiratory: Denies shortness of breath. Gastrointestinal: Endorses flank pain.  No abdominal pain.  No nausea, no vomiting.  No diarrhea. Genitourinary: Negative for dysuria. Musculoskeletal: Negative for acute arthralgias Skin: Negative for rash. Neurological: Negative for headaches, weakness/numbness/paresthesias in any extremity Psychiatric: Negative for suicidal ideation/homicidal ideation   ____________________________________________   PHYSICAL EXAM:  VITAL SIGNS: ED Triage Vitals  Enc Vitals Group     BP 05/30/20 1301 (!) 157/90     Pulse Rate 05/30/20 1301 (!) 104     Resp 05/30/20 1301 20     Temp 05/30/20 1301 98.6 F (37 C)     Temp Source 05/30/20 1301 Oral     SpO2 05/30/20 1301 100 %  Weight 05/30/20 1302 154 lb 1.6 oz (69.9 kg)     Height 05/30/20 1302 5' (1.524 m)     Head Circumference --      Peak Flow --      Pain Score 05/30/20 1301 2     Pain Loc --      Pain Edu? --      Excl. in GC? --    Constitutional: Alert and oriented. Well appearing and in no acute  distress. Eyes: Conjunctivae are normal. PERRL. Head: Atraumatic. Nose: No congestion/rhinnorhea. Mouth/Throat: Mucous membranes are moist. Neck: No stridor Cardiovascular: Grossly normal heart sounds.  Good peripheral circulation. Respiratory: Normal respiratory effort.  No retractions. Gastrointestinal: Right CVA tenderness to percussion.  Soft and nontender. No distention. Musculoskeletal: No obvious deformities Neurologic:  Normal speech and language. No gross focal neurologic deficits are appreciated. Skin:  Skin is warm and dry. No rash noted. Psychiatric: Mood and affect are normal. Speech and behavior are normal.  ____________________________________________   LABS (all labs ordered are listed, but only abnormal results are displayed)  Labs Reviewed  RESP PANEL BY RT-PCR (FLU A&B, COVID) [id number]  CULTURE, BLOOD (ROUTINE X 2)  CULTURE, BLOOD (ROUTINE X 2)  COMPREHENSIVE METABOLIC PANEL  BRAIN NATRIURETIC PEPTIDE  CBC WITH DIFFERENTIAL/PLATELET  LACTIC ACID, PLASMA  LACTIC ACID, PLASMA  TROPONIN I (HIGH SENSITIVITY)  TROPONIN I (HIGH SENSITIVITY)    RADIOLOGY  ED MD interpretation: CT noncontrast of the abdomen as a kidney stone protocol shows severe right hydronephrosis secondary to stones measuring up to 6 mm in the distal right ureter  Official radiology report(s): CT RENAL STONE STUDY  Addendum Date: 05/30/2020   ADDENDUM REPORT: 05/30/2020 10:45 ADDENDUM: The following was included in the body of the original report, but was not stated specifically in the impression section: New small pericardial effusion. Electronically Signed   By: Leanna Battles M.D.   On: 05/30/2020 10:45   Result Date: 05/30/2020 CLINICAL DATA:  Right lower quadrant pain, flank pain, lithotripsy 2 weeks ago. Increased urinary frequency. EXAM: CT ABDOMEN AND PELVIS WITHOUT CONTRAST TECHNIQUE: Multidetector CT imaging of the abdomen and pelvis was performed following the standard protocol  without IV contrast. COMPARISON:  05/04/2020. FINDINGS: Lower chest: 1 mm peripheral left lower lobe nodule, likely benign. Heart size normal. New small pericardial effusion. No pleural effusion. Distal esophagus is unremarkable. Hepatobiliary: Liver and gallbladder are unremarkable. No biliary ductal dilatation. Pancreas: Negative. Spleen: Negative. Adrenals/Urinary Tract: Adrenal glands are unremarkable. Right renal edema with severe right hydronephrosis secondary to a 6 mm stone in the distal right ureter (2/53 and coronal image 56), similar to 05/04/2020. There may be a punctate stone slightly more superiorly within right ureter (2/51). Distal right ureter is otherwise decompressed. Left kidney is unremarkable. Left ureter is decompressed. Bladder is low in volume. Stomach/Bowel: Stomach, small bowel, appendix and colon are unremarkable. Vascular/Lymphatic: Vascular structures are unremarkable. No pathologically enlarged lymph nodes. Reproductive: Uterus is visualized.  No adnexal mass. Other: No free fluid.  Mesenteries and peritoneum are unremarkable. Musculoskeletal: Minimal degenerative change in the spine. IMPRESSION: Severe right hydronephrosis secondary to stones measuring up to 6 mm in the distal right ureter. Hydronephrosis has increased in the interval with the largest stone being in similar position to 05/04/2020. Electronically Signed: By: Leanna Battles M.D. On: 05/30/2020 10:19    ____________________________________________   PROCEDURES  Procedure(s) performed (including Critical Care):  .1-3 Lead EKG Interpretation Performed by: Merwyn Katos, MD Authorized by: Merwyn Katos, MD  Interpretation: normal     ECG rate:  96   ECG rate assessment: normal     Rhythm: sinus rhythm     Ectopy: none     Conduction: normal       ____________________________________________   INITIAL IMPRESSION / ASSESSMENT AND PLAN / ED COURSE  As part of my medical decision making, I  reviewed the following data within the electronic MEDICAL RECORD NUMBER Nursing notes reviewed and incorporated, Labs reviewed, Old chart reviewed, Radiograph reviewed and Notes from prior ED visits reviewed and incorporated        Patient a 55 year old female with a stated recent diagnosis of right-sided kidney stone with associated hydronephrosis and likely infection who presents from urology clinic with incidental finding on CT scan of a new pericardial effusion.  In speaking with cardiology, they did not see any benefit of patient being worked up for this pericardial effusion in the inpatient setting given that it is small and that patient is asymptomatic from this.  Patient was taken to the OR prior to full evaluation. Dispo: Admit to medicine     ____________________________________________   FINAL CLINICAL IMPRESSION(S) / ED DIAGNOSES  Final diagnoses:  Kidney stone on right side  Hydronephrosis of right kidney  Pericardial effusion     ED Discharge Orders    None       Note:  This document was prepared using Dragon voice recognition software and may include unintentional dictation errors.   Merwyn Katos, MD 05/31/20 1146

## 2020-05-30 NOTE — Progress Notes (Signed)
05/30/2020 10:44 AM   Carolyn Trevino [facility] [birth year] [id number]  CC: Chief Complaint  Patient presents with  . Other    HPI: Carolyn Trevino is a 55 y.o. female s/p ESWL 18 days ago with Dr. Richardo Hanks for management of an 8 mm mid right ureteral calculus who presents today for evaluation of fever and flank pain.  I saw her in clinic most recently 4 days ago for postop follow-up.  She had not yet passed all of her stone at that time and UA was consistent with ongoing stone episode versus UTI, however patient was asymptomatic and vitals are stable at that time.  Since that visit, her urine culture has resulted with pansensitive E. coli and she contacted clinic this morning to report a fever at home.  Today she reports chills and anorexia yesterday with poor sleep overnight.  She awoke this morning with subjective fever.  She took her temperature several times at home, T-max 101 F.  She took 200 mg of ibuprofen this morning around 5:30 AM and is now afebrile in clinic.  She had breakfast this morning around 8:30 AM and is now NPO.  She denies dysuria, urgency, frequency, nausea, or vomiting.  She underwent stat CT stone study today which revealed worsening right hydronephrosis secondary to a 6 mm stone in the distal right ureter as well as a punctate more proximal right ureteral stone.  No perinephric stranding noted.  Additionally, she has a new small pericardial effusion.  In-office UA today positive for trace intact blood and 2+ leukocyte esterase; urine microscopy with >30 WBCs/HPF.  PMH: History reviewed. No pertinent past medical history.  Surgical History: Past Surgical History:  Procedure Laterality Date  . ABLATION ON ENDOMETRIOSIS    . ANAL FISSURE REPAIR    . EXTRACORPOREAL SHOCK WAVE LITHOTRIPSY Right 05/12/2020   Procedure: EXTRACORPOREAL SHOCK WAVE LITHOTRIPSY (ESWL);  Surgeon: Sondra Come, MD;  Location: [facility] ORS;  Service: Urology;  Laterality: Right;  . EYE  SURGERY    . WISDOM TOOTH EXTRACTION      Home Medications:  Allergies as of 05/30/2020      Reactions   Anesthetics, Amide Nausea And Vomiting   Hydrocodone Nausea Only   Propofol Nausea Only   Wasp Venom Swelling   Wasp Venom Protein    Sulfamethoxazole-trimethoprim Rash      Medication List       Accurate as of May 30, 2020 10:44 AM. If you have any questions, ask your nurse or doctor.        butalbital-acetaminophen-caffeine 50-325-40 MG tablet Commonly known as: FIORICET Take 1 tablet by mouth every 6 (six) hours as needed.   meloxicam 15 MG tablet Commonly known as: MOBIC Take 15 mg by mouth daily.   Multi-Vitamin Gummies Chew   tamsulosin 0.4 MG Caps capsule Commonly known as: FLOMAX Take 1 capsule (0.4 mg total) by mouth daily after supper.   tiZANidine 4 MG tablet Commonly known as: ZANAFLEX tizanidine 4 mg tablet   Vitamin C Gummies 125 MG Chew Generic drug: Ascorbic Acid       Allergies:  Allergies  Allergen Reactions  . Anesthetics, Amide Nausea And Vomiting  . Hydrocodone Nausea Only  . Propofol Nausea Only  . Wasp Venom Swelling  . Wasp Venom Protein   . Sulfamethoxazole-Trimethoprim Rash    Family History: Family History  Problem Relation Age of Onset  . Breast cancer Mother 40  . Prostate cancer Father     Social History:  reports that she has never smoked. She has never used smokeless tobacco. No history on file for alcohol use and drug use.  Physical Exam: BP 108/77   Pulse (!) 123   Temp 98 F (36.7 C) (Oral)   Ht 5' (1.524 m)   Wt 154 lb (69.9 kg)   BMI 30.08 kg/m   Constitutional:  Alert and oriented, no acute distress, nontoxic appearing HEENT: [place], AT Cardiovascular: No clubbing, cyanosis, or edema Respiratory: Normal respiratory effort, no increased work of breathing Skin: No rashes, bruises or suspicious lesions Neurologic: Grossly intact, no focal deficits, moving all 4 extremities Psychiatric: Normal mood  and affect  Laboratory Data: See Epic.  Pertinent Imaging: CT stone study, 05/30/2020: CLINICAL DATA:  Right lower quadrant pain, flank pain, lithotripsy 2 weeks ago. Increased urinary frequency.  EXAM: CT ABDOMEN AND PELVIS WITHOUT CONTRAST  TECHNIQUE: Multidetector CT imaging of the abdomen and pelvis was performed following the standard protocol without IV contrast.  COMPARISON:  05/04/2020.  FINDINGS: Lower chest: 1 mm peripheral left lower lobe nodule, likely benign. Heart size normal. New small pericardial effusion. No pleural effusion. Distal esophagus is unremarkable.  Hepatobiliary: Liver and gallbladder are unremarkable. No biliary ductal dilatation.  Pancreas: Negative.  Spleen: Negative.  Adrenals/Urinary Tract: Adrenal glands are unremarkable. Right renal edema with severe right hydronephrosis secondary to a 6 mm stone in the distal right ureter (2/53 and coronal image 56), similar to 05/04/2020. There may be a punctate stone slightly more superiorly within right ureter (2/51). Distal right ureter is otherwise decompressed. Left kidney is unremarkable. Left ureter is decompressed. Bladder is low in volume.  Stomach/Bowel: Stomach, small bowel, appendix and colon are unremarkable.  Vascular/Lymphatic: Vascular structures are unremarkable. No pathologically enlarged lymph nodes.  Reproductive: Uterus is visualized.  No adnexal mass.  Other: No free fluid.  Mesenteries and peritoneum are unremarkable.  Musculoskeletal: Minimal degenerative change in the spine.  IMPRESSION: Severe right hydronephrosis secondary to stones measuring up to 6 mm in the distal right ureter. Hydronephrosis has increased in the interval with the largest stone being in similar position to 05/04/2020.  Electronically Signed: By: Leanna Battles M.D. On: 05/30/2020 10:19  ADDENDUM REPORT: 05/30/2020 10:45  ADDENDUM: The following was included in the body  of the original report, but was not stated specifically in the impression section:  New small pericardial effusion.   Electronically Signed   By: Leanna Battles M.D.   On: 05/30/2020 10:45  I personally reviewed the images referenced above and note right hydronephrosis to the level of a 6 mm and right ureteral stone with a more proximal punctate fragment.  Assessment & Plan:   1. Right ureteral stone 55 year old female s/p ESWL for management of a mid right ureteral stone 18 days ago who now presents with fever in the setting of a retained 6 mm mid right ureteral fragment.  She is afebrile today after taking antipyretics at home, tachycardic, and normotensive.  CT findings notable for new small pericardial effusion.  I explained to the patient that I am concerned for infected retained fragment requiring further intervention in the form of urgent ureteral stenting today.  I explained that she may be admitted for IV antibiotics and that we would plan for definitive stone management at a later date.  In consultation with Dr. Apolinar Junes, I counseled her to proceed to the emergency department for further evaluation, especially given her findings of pericardial effusion on imaging today.  I contacted the ED to inform them  of her impending arrival.  Patient is in agreement with this plan.  I counseled the patient to remain n.p.o.  Notably, she has not received antibiotics for pansensitive E. coli in her urine.  I will defer to ED staff for administration of IV antibiotics in this case - Urinalysis, Complete - CULTURE, URINE COMPREHENSIVE   Return for Proceed to ED for further evaluation.  Carman Ching, PA-C  [facility] [address] [place], [place] [address] [phone]

## 2020-05-30 NOTE — ED Notes (Signed)
Consent signed and placed in patient chart.

## 2020-05-30 NOTE — H&P (Signed)
History and Physical    Carolyn Trevino [facility] ZOX:[id number] DOB: 1966-01-28 DOA: 05/30/2020  Referring MD/NP/PA:   PCP: Enid Baas, MD   Patient coming from:  The patient is coming from home.  At baseline, pt is independent for most of ADL.        Chief Complaint: right flank pain and fever.  HPI: Carolyn Trevino is a 55 y.o. female with medical history significant of benign pancreatic lesion, benign left breast mass, kidney stone, who presents with right flank pain and fever.  Pt underwent ESWL 18 days ago with Dr. Richardo Hanks for management of an 8 mm mid right ureteral calculus. Pt was followed up by urology in clinic 4 days ago. Per Dr. Delana Meyer note, she had not yet passed all of her stone at that time. Her urine culture on 2/24 was positive for pansensitive E. Coli.  Patient states that she developed subjective fever this morning with chills.  She had temperature 101. She took 200 mg of ibuprofen, and her fever has resolved.  She also complains of right flank pain.  No nausea, vomiting, diarrhea or abdominal pain.  Patient denies chest pain, cough, shortness of breath.  Also denies symptoms of UTI. Pt was seen Dr. Apolinar Junes of urology and had stat CT stone study today which revealed worsening right hydronephrosis secondary to a 6 mm stone in the distal right ureter as well as a punctate more proximal right ureteral stone. CT scan also showed new small pericardial effusion. Pt underwent urgent cystoscopy with right ureteral stent placement by Dr. Apolinar Junes.  ED Course: pt was found to have WBC 8.8, negative Covid PCR, AKI with creatinine 1.20, BUN 14, lactic acid 1.0, temperature normal, blood pressure 157/90, heart rate of 124, 104, RR 20, oxygen saturation 100% on room air.  Patient is admitted to MedSurg bed see patient.  Review of Systems:   General: has fevers, chills, no body weight gain, has poor appetite, has fatigue HEENT: no blurry vision, hearing changes or sore  throat Respiratory: no dyspnea, coughing, wheezing CV: no chest pain, no palpitations GI: no nausea, vomiting, abdominal pain, diarrhea, constipation GU: no dysuria, burning on urination, increased urinary frequency, hematuria  Ext: no leg edema Neuro: no unilateral weakness, numbness, or tingling, no vision change or hearing loss Skin: no rash, no skin tear. MSK: has right flank pain Heme: No easy bruising.  Travel history: No recent long distant travel.  Allergy:  Allergies  Allergen Reactions  . Anesthetics, Amide Nausea And Vomiting  . Hydrocodone Nausea Only  . Propofol Nausea Only  . Wasp Venom Swelling  . Wasp Venom Protein   . Sulfamethoxazole-Trimethoprim Rash    Past Medical History:  Diagnosis Date  . Complication of anesthesia    n/v  . Left breast mass   . Pancreatic lesion     Past Surgical History:  Procedure Laterality Date  . ABLATION ON ENDOMETRIOSIS    . ANAL FISSURE REPAIR    . EXTRACORPOREAL SHOCK WAVE LITHOTRIPSY Right 05/12/2020   Procedure: EXTRACORPOREAL SHOCK WAVE LITHOTRIPSY (ESWL);  Surgeon: Sondra Come, MD;  Location: [facility] ORS;  Service: Urology;  Laterality: Right;  . EYE SURGERY    . WISDOM TOOTH EXTRACTION      Social History:  reports that she has never smoked. She has never used smokeless tobacco. No history on file for alcohol use and drug use.  Family History:  Family History  Problem Relation Age of Onset  . Breast cancer Mother 42  .  Prostate cancer Father      Prior to Admission medications   Medication Sig Start Date End Date Taking? Authorizing Provider  Ascorbic Acid (VITAMIN C GUMMIES) 125 MG CHEW     [provider]  butalbital-acetaminophen-caffeine (FIORICET) 50-325-40 MG tablet Take 1 tablet by mouth every 6 (six) hours as needed. 04/14/20   [provider]  meloxicam (MOBIC) 15 MG tablet Take 15 mg by mouth daily. 02/26/20   [provider]  Multiple Vitamins-Minerals (MULTI-VITAMIN  GUMMIES) CHEW     [provider]  tamsulosin (FLOMAX) 0.4 MG CAPS capsule Take 1 capsule (0.4 mg total) by mouth daily after supper. 05/26/20   Vaillancourt, Lelon Mast, PA-C  tiZANidine (ZANAFLEX) 4 MG tablet tizanidine 4 mg tablet    [provider]    Physical Exam: Vitals:   05/30/20 1717 05/30/20 1732 05/30/20 1747 05/30/20 1756  BP: 120/73 (!) 118/43    Pulse: 90 (!) 107 92   Resp: (!) 24 (!) 27    Temp:    98.8 F (37.1 C)  TempSrc:      SpO2: 98% 98% 98%   Weight:      Height:       General: Not in acute distress HEENT:       Eyes: PERRL, EOMI, no scleral icterus.       ENT: No discharge from the ears and nose, no pharynx injection, no tonsillar enlargement.        Neck: No JVD, no bruit, no mass felt. Heme: No neck lymph node enlargement. Cardiac: S1/S2, RRR, No murmurs, No gallops or rubs. Respiratory: No rales, wheezing, rhonchi or rubs. GI: Soft, nondistended, nontender, no rebound pain, no organomegaly, BS present. GU: No hematuria. Positive right CVA tenderness Ext: No pitting leg edema bilaterally. 1+DP/PT pulse bilaterally. Musculoskeletal: No joint deformities, No joint redness or warmth, no limitation of ROM in spin. Skin: No rashes.  Neuro: Alert, oriented X3, cranial nerves II-XII grossly intact, moves all extremities normally Psych: Patient is not psychotic, no suicidal or hemocidal ideation.  Labs on Admission: I have personally reviewed following labs and imaging studies  CBC: Recent Labs  Lab 05/30/20 1336  WBC 8.8  NEUTROABS 6.8  HGB 11.6*  HCT 35.8*  MCV 85.4  PLT 335   Basic Metabolic Panel: Recent Labs  Lab 05/30/20 1336  NA 137  K 3.7  CL 102  CO2 24  GLUCOSE 103*  BUN 14  CREATININE 1.20*  CALCIUM 9.4   GFR: Estimated Creatinine Clearance: 46.2 mL/min (A) (by C-G formula based on SCr of 1.2 mg/dL (H)). Liver Function Tests: Recent Labs  Lab 05/30/20 1336  AST 21  ALT 20  ALKPHOS 101  BILITOT 0.8  PROT  8.1  ALBUMIN 3.9   No results for input(s): LIPASE, AMYLASE in the last 168 hours. No results for input(s): AMMONIA in the last 168 hours. Coagulation Profile: No results for input(s): INR, PROTIME in the last 168 hours. Cardiac Enzymes: No results for input(s): CKTOTAL, CKMB, CKMBINDEX, TROPONINI in the last 168 hours. BNP (last 3 results) No results for input(s): PROBNP in the last 8760 hours. HbA1C: No results for input(s): HGBA1C in the last 72 hours. CBG: No results for input(s): GLUCAP in the last 168 hours. Lipid Profile: No results for input(s): CHOL, HDL, LDLCALC, TRIG, CHOLHDL, LDLDIRECT in the last 72 hours. Thyroid Function Tests: No results for input(s): TSH, T4TOTAL, FREET4, T3FREE, THYROIDAB in the last 72 hours. Anemia Panel: No results for input(s): VITAMINB12,  FOLATE, FERRITIN, TIBC, IRON, RETICCTPCT in the last 72 hours. Urine analysis:    Component Value Date/Time   APPEARANCEUR Clear 05/30/2020 1054   GLUCOSEU Negative 05/30/2020 1054   BILIRUBINUR Negative 05/30/2020 1054   PROTEINUR Negative 05/30/2020 1054   NITRITE Negative 05/30/2020 1054   LEUKOCYTESUR 2+ (A) 05/30/2020 1054   Sepsis Labs: @LABRCNTIP (procalcitonin:4,lacticidven:4) ) Recent Results (from the past 240 hour(s))  Microscopic Examination     Status: Abnormal   Collection Time: 05/26/20  9:13 AM   Urine  Result Value Ref Range Status   WBC, UA >30 (A) 0 - 5 /hpf Final   RBC 3-10 (A) 0 - 2 /hpf Final   Epithelial Cells (non renal) 0-10 0 - 10 /hpf Final   Renal Epithel, UA 0-10 (A) None seen /hpf Final   Casts Present (A) None seen /lpf Final   Cast Type Hyaline casts N/A Final   Bacteria, UA Few None seen/Few Final  CULTURE, URINE COMPREHENSIVE     Status: Abnormal   Collection Time: 05/26/20  9:52 AM   Specimen: Urine   UR  Result Value Ref Range Status   Urine Culture, Comprehensive Final report (A)  Final   Organism ID, Bacteria Escherichia coli (A)  Final    Comment:  Cefazolin <=4 ug/mL Cefazolin with an MIC <=16 predicts susceptibility to the oral agents cefaclor, cefdinir, cefpodoxime, cefprozil, cefuroxime, cephalexin, and loracarbef when used for therapy of uncomplicated urinary tract infections due to E. coli, Klebsiella pneumoniae, and Proteus mirabilis. 50,000-100,000 colony forming units per mL    Organism ID, Bacteria Comment  Final    Comment: Mixed urogenital flora 2,000 Colonies/mL    ANTIMICROBIAL SUSCEPTIBILITY Comment  Final    Comment:       ** S = Susceptible; I = Intermediate; R = Resistant **                    P = Positive; N = Negative             MICS are expressed in micrograms per mL    Antibiotic                 RSLT#1    RSLT#2    RSLT#3    RSLT#4 Amoxicillin/Clavulanic Acid    S Ampicillin                     S Cefepime                       S Ceftriaxone                    S Cefuroxime                     S Ciprofloxacin                  S Ertapenem                      S Gentamicin                     S Imipenem                       S Levofloxacin                   S Meropenem  S Nitrofurantoin                 S Piperacillin/Tazobactam        S Tetracycline                   S Tobramycin                     S Trimethoprim/Sulfa             S   Microscopic Examination     Status: Abnormal (Preliminary result)   Collection Time: 05/30/20 10:54 AM   Urine  Result Value Ref Range Status   WBC, UA >30 (A) 0 - 5 /hpf Final   RBC 0-2 0 - 2 /hpf Final   Epithelial Cells (non renal) 0-10 0 - 10 /hpf Final   Renal Epithel, UA WILL FOLLOW  Preliminary   Casts Present (A) None seen /lpf Final   Cast Type Hyaline casts N/A Final   Bacteria, UA Few None seen/Few Final  Resp Panel by RT-PCR (Flu A&B, Covid) Nasopharyngeal Swab     Status: None   Collection Time: 05/30/20 11:31 AM   Specimen: Nasopharyngeal Swab; Nasopharyngeal(NP) swabs in vial transport medium  Result Value Ref Range Status    SARS Coronavirus 2 by RT PCR NEGATIVE NEGATIVE Final    Comment: (NOTE) SARS-CoV-2 target nucleic acids are NOT DETECTED.  The SARS-CoV-2 RNA is generally detectable in upper respiratory specimens during the acute phase of infection. The lowest concentration of SARS-CoV-2 viral copies this assay can detect is 138 copies/mL. A negative result does not preclude SARS-Cov-2 infection and should not be used as the sole basis for treatment or other patient management decisions. A negative result may occur with  improper specimen collection/handling, submission of specimen other than nasopharyngeal swab, presence of viral mutation(s) within the areas targeted by this assay, and inadequate number of viral copies(<138 copies/mL). A negative result must be combined with clinical observations, patient history, and epidemiological information. The expected result is Negative.  Fact Sheet for Patients:  BloggerCourse.comhttps://www.fda.gov/media/152166/download  Fact Sheet for Healthcare Providers:  SeriousBroker.ithttps://www.fda.gov/media/152162/download  This test is no t yet approved or cleared by the [place] FDA and  has been authorized for detection and/or diagnosis of SARS-CoV-2 by FDA under an Emergency Use Authorization (EUA). This EUA will remain  in effect (meaning this test can be used) for the duration of the COVID-19 declaration under Section 564(b)(1) of the Act, 21 U.S.C.section 360bbb-3(b)(1), unless the authorization is terminated  or revoked sooner.       Influenza A by PCR NEGATIVE NEGATIVE Final   Influenza B by PCR NEGATIVE NEGATIVE Final    Comment: (NOTE) The Xpert Xpress SARS-CoV-2/FLU/RSV plus assay is intended as an aid in the diagnosis of influenza from Nasopharyngeal swab specimens and should not be used as a sole basis for treatment. Nasal washings and aspirates are unacceptable for Xpert Xpress SARS-CoV-2/FLU/RSV testing.  Fact Sheet for  Patients: BloggerCourse.comhttps://www.fda.gov/media/152166/download  Fact Sheet for Healthcare Providers: SeriousBroker.ithttps://www.fda.gov/media/152162/download  This test is not yet approved or cleared by the [place] FDA and has been authorized for detection and/or diagnosis of SARS-CoV-2 by FDA under an Emergency Use Authorization (EUA). This EUA will remain in effect (meaning this test can be used) for the duration of the COVID-19 declaration under Section 564(b)(1) of the Act, 21 U.S.C. section 360bbb-3(b)(1), unless the authorization is terminated or revoked.  Performed at [facility], [address] [place],  [place] [address]      Radiological Exams on Admission: DG OR UROLOGY CYSTO IMAGE [facility] ONLY)  Result Date: 05/30/2020 There is no interpretation for this exam.  This order is for images obtained during a surgical procedure.  Please See "Surgeries" Tab for more information regarding the procedure.   CT RENAL STONE STUDY  Addendum Date: 05/30/2020   ADDENDUM REPORT: 05/30/2020 10:45 ADDENDUM: The following was included in the body of the original report, but was not stated specifically in the impression section: New small pericardial effusion. Electronically Signed   By: Leanna Battles M.D.   On: 05/30/2020 10:45   Result Date: 05/30/2020 CLINICAL DATA:  Right lower quadrant pain, flank pain, lithotripsy 2 weeks ago. Increased urinary frequency. EXAM: CT ABDOMEN AND PELVIS WITHOUT CONTRAST TECHNIQUE: Multidetector CT imaging of the abdomen and pelvis was performed following the standard protocol without IV contrast. COMPARISON:  05/04/2020. FINDINGS: Lower chest: 1 mm peripheral left lower lobe nodule, likely benign. Heart size normal. New small pericardial effusion. No pleural effusion. Distal esophagus is unremarkable. Hepatobiliary: Liver and gallbladder are unremarkable. No biliary ductal dilatation. Pancreas: Negative. Spleen: Negative. Adrenals/Urinary Tract: Adrenal glands are  unremarkable. Right renal edema with severe right hydronephrosis secondary to a 6 mm stone in the distal right ureter (2/53 and coronal image 56), similar to 05/04/2020. There may be a punctate stone slightly more superiorly within right ureter (2/51). Distal right ureter is otherwise decompressed. Left kidney is unremarkable. Left ureter is decompressed. Bladder is low in volume. Stomach/Bowel: Stomach, small bowel, appendix and colon are unremarkable. Vascular/Lymphatic: Vascular structures are unremarkable. No pathologically enlarged lymph nodes. Reproductive: Uterus is visualized.  No adnexal mass. Other: No free fluid.  Mesenteries and peritoneum are unremarkable. Musculoskeletal: Minimal degenerative change in the spine. IMPRESSION: Severe right hydronephrosis secondary to stones measuring up to 6 mm in the distal right ureter. Hydronephrosis has increased in the interval with the largest stone being in similar position to 05/04/2020. Electronically Signed: By: Leanna Battles M.D. On: 05/30/2020 10:19     EKG: I have personally reviewed.  Sinus rhythm, QTC 427, borderline LAD, poor R wave progression   Assessment/Plan Principal Problem:   Ureteral stone Active Problems:   Hydronephrosis due to obstruction of ureter-right side   Acute pyelonephritis   Pericardial effusion  Acute pyelonephritis and ureteral stone and Hydronephrosis due to obstruction of ureter-right side: Dr. Apolinar Junes of urology is consulted.  Patient is s/p of cystoscopy with right ureteral stent placement by Dr. Apolinar Junes.  -Admitted to MedSurg bed as inpatient -IV Rocephin -Follow-up of blood culture and urine culture -As needed morphine for pain -As needed Zofran for nausea  Pericardial effusion: Incidental finding by CT scan, patient has small pericardial effusion.  Asymptomatic -2D echo   DVT ppx: SCD Code Status: Full code Family Communication: not done, no family member is at bed side.    Disposition Plan:   Anticipate discharge back to previous environment Consults called:  Dr. Apolinar Junes of urology Admission status and Level of care: Med-Surg:     as inpt        Status is: Inpatient  Remains inpatient appropriate because:Inpatient level of care appropriate due to severity of illness   Dispo: The patient is from: Home              Anticipated d/c is to: Home              Patient currently is not medically stable to d/c.   Difficult to  place patient No          Date of Service 05/30/2020    Lorretta Harp [facility]   If 7PM-7AM, please contact night-coverage [email or web address] 05/30/2020, 6:02 PM

## 2020-05-30 NOTE — Anesthesia Preprocedure Evaluation (Addendum)
Anesthesia Evaluation  Patient identified by MRN, date of birth, ID band Patient awake    Reviewed: Allergy & Precautions, H&P , NPO status , Patient's Chart, lab work & pertinent test results  History of Anesthesia Complications (+) PONV and history of anesthetic complications  Airway Mallampati: II  TM Distance: >3 FB     Dental  (+) Teeth Intact   Pulmonary neg pulmonary ROS, neg sleep apnea, neg COPD,    breath sounds clear to auscultation       Cardiovascular (-) angina(-) Past MI and (-) Cardiac Stents (-) dysrhythmias  Rhythm:regular Rate:Tachycardia + Systolic murmurs Small pericardial effusion noted incidentally on imaging   Neuro/Psych negative neurological ROS  negative psych ROS   GI/Hepatic negative GI ROS, Neg liver ROS,   Endo/Other  negative endocrine ROS  Renal/GU Renal diseaseObstructing stone     Musculoskeletal   Abdominal   Peds  Hematology negative hematology ROS (+)   Anesthesia Other Findings Past Medical History: No date: Complication of anesthesia     Comment:  n/v No date: Left breast mass No date: Pancreatic lesion  Past Surgical History: No date: ABLATION ON ENDOMETRIOSIS No date: ANAL FISSURE REPAIR 05/12/2020: EXTRACORPOREAL SHOCK WAVE LITHOTRIPSY; Right     Comment:  Procedure: EXTRACORPOREAL SHOCK WAVE LITHOTRIPSY (ESWL);              Surgeon: Sondra Come, MD;  Location: [facility] ORS;                Service: Urology;  Laterality: Right; No date: EYE SURGERY No date: WISDOM TOOTH EXTRACTION  BMI    Body Mass Index: 30.10 kg/m      Reproductive/Obstetrics negative OB ROS                             Anesthesia Physical Anesthesia Plan  ASA: III  Anesthesia Plan: General ETT   Post-op Pain Management:    Induction: Rapid sequence  PONV Risk Score and Plan: Ondansetron, Dexamethasone, Midazolam and Treatment may vary due to age or medical  condition  Airway Management Planned:   Additional Equipment:   Intra-op Plan:   Post-operative Plan:   Informed Consent: I have reviewed the patients History and Physical, chart, labs and discussed the procedure including the risks, benefits and alternatives for the proposed anesthesia with the patient or authorized representative who has indicated his/her understanding and acceptance.     Dental Advisory Given  Plan Discussed with: Anesthesiologist, CRNA and Surgeon  Anesthesia Plan Comments: (Reports PONV but denies any specific allergy to propofol or lidocaine (despite being on allergy list).  Last ate at 8:30 am, not yet NPO appropriate but per Dr. Apolinar Junes must proceed emergently.  Plan RSI.)        Anesthesia Quick Evaluation

## 2020-05-30 NOTE — Transfer of Care (Signed)
Immediate Anesthesia Transfer of Care Note  Patient: Carolyn Trevino [facility]  Procedure(s) Performed: CYSTOSCOPY WITH STENT PLACEMENT (Right )  Patient Location: PACU  Anesthesia Type:General  Level of Consciousness: awake, drowsy and patient cooperative  Airway & Oxygen Therapy: Patient Spontanous Breathing and Patient connected to face mask oxygen  Post-op Assessment: Report given to RN and Post -op Vital signs reviewed and stable  Post vital signs: Reviewed and stable  Last Vitals:  Vitals Value Taken Time  BP 103/68 05/30/20 1617  Temp 37.3 C 05/30/20 1617  Pulse 97 05/30/20 1625  Resp 26 05/30/20 1625  SpO2 100 % 05/30/20 1625  Vitals shown include unvalidated device data.  Last Pain:  Vitals:   05/30/20 1617  TempSrc:   PainSc: 0-No pain         Complications: No complications documented.

## 2020-05-30 NOTE — Telephone Encounter (Signed)
pt has seen her results in her MyChart, says she has an infection and was actually running a fever this morning, and would like to know if she is going to be given antibiotics. Please advise.

## 2020-05-30 NOTE — Anesthesia Procedure Notes (Signed)
Procedure Name: Intubation Date/Time: 05/30/2020 3:41 PM Performed by: Henrietta Hoover, CRNA Pre-anesthesia Checklist: Patient identified, Emergency Drugs available, Suction available and Patient being monitored Patient Re-evaluated:Patient Re-evaluated prior to induction Oxygen Delivery Method: Circle system utilized Preoxygenation: Pre-oxygenation with 100% oxygen Induction Type: IV induction, Cricoid Pressure applied and Rapid sequence Ventilation: Mask ventilation without difficulty Laryngoscope Size: 3 and McGraph Grade View: Grade II Tube type: Oral Tube size: 7.0 mm Number of attempts: 1 Airway Equipment and Method: Stylet and Video-laryngoscopy Placement Confirmation: ETT inserted through vocal cords under direct vision,  positive ETCO2 and breath sounds checked- equal and bilateral Secured at: 21 cm Tube secured with: Tape Dental Injury: Teeth and Oropharynx as per pre-operative assessment

## 2020-05-30 NOTE — Plan of Care (Signed)
Pt progressing, tolerating clear liquids.

## 2020-05-30 NOTE — Op Note (Signed)
Date of procedure: 05/30/20  Preoperative diagnosis:  1. Right obstructing ureteral calculus 2. Febrile UTI  Postoperative diagnosis:  1. Same as above  Procedure: 1. Cystoscopy 2. Retrograde Polygram 3. Right ureteral stent placement  Surgeon: [name], MD  Anesthesia: General  Complications: None  Intraoperative findings: Patchy bladder erythema consistent with cystitis.  Retrograde with proximal hydroureteronephrosis with filling defect of the level of the stone, decompressed distal.  Purulent debris-filled urine upon stent placement.  EBL: Minimal  Specimens: None  Drains: 6 x 22 French double-J ureteral stent on right  Indication: Carolyn Trevino is a 55 y.o. patient with obstructing right ureteral calculus, UTI, fevers to 101 as well as tachycardia concerning for sepsis of urinary source.  After reviewing the management options for treatment, she elected to proceed with the above surgical procedure(s). We have discussed the potential benefits and risks of the procedure, side effects of the proposed treatment, the likelihood of the patient achieving the goals of the procedure, and any potential problems that might occur during the procedure or recuperation. Informed consent has been obtained.  Description of procedure:  The patient was taken to the operating room and general anesthesia was induced.  The patient was placed in the dorsal lithotomy position, prepped and draped in the usual sterile fashion, and preoperative antibiotics were administered. A preoperative time-out was performed.   21 [place] scope was advanced per urethra into the bladder.  The bladder was diffusely erythematous with some mild debris consistent with cystitis.  Attention was turned to the right ureteral orifice.  A gentle retrograde pyelogram on this side revealed distally decompressed ureter to the level of the stoma there is a filling defect.  There is hydroureteronephrosis, severe proximal to  this.  Use a very small amount of contrast under low pressure to perform this retrograde.  The wires in place up to the level of the stone which actually went quite easily.  I then advanced a 6 x 22 [place] with some difficulty upon approaching the stone but ultimately successful up to the level of the kidney.  The wire was withdrawn and a partial coil was noted within the upper tract calyx.  A full coil was noted within the bladder.  Notably, upon stent placement, there was some purulent debris effluxing from the stent consistent with an obstructed upper tract.  The bladder was then drained.  The patient was then positioned on the operative bed, reversed from anesthesia and taken to the PACU in stable condition.  Plan: She will be admitted to the hospital service for observation.  She previously grew pansensitive E. coli.  She received Levaquin intraoperatively.  We will follow up cultures and eventually plan for staged procedure as an outpatient when she recovers.  [name], M.D.

## 2020-05-30 NOTE — ED Triage Notes (Signed)
Patient to ER after CT revealed hydronephrosis with kidney stone and pleural effusion. Patient to go to OR to have stent placed today with Dr. Apolinar Junes.

## 2020-05-30 NOTE — Anesthesia Postprocedure Evaluation (Signed)
Anesthesia Post Note  Patient: Carolyn Trevino  Procedure(s) Performed: CYSTOSCOPY WITH STENT PLACEMENT (Right )  Patient location during evaluation: PACU Anesthesia Type: General Level of consciousness: awake and alert Pain management: pain level controlled Vital Signs Assessment: post-procedure vital signs reviewed and stable Respiratory status: spontaneous breathing, nonlabored ventilation and respiratory function stable Cardiovascular status: blood pressure returned to baseline and stable Postop Assessment: no apparent nausea or vomiting Anesthetic complications: no   No complications documented.   Last Vitals:  Vitals:   05/30/20 1747 05/30/20 1756  BP:    Pulse: 92   Resp:    Temp:  37.1 C  SpO2: 98%     Last Pain:  Vitals:   05/30/20 1756  TempSrc:   PainSc: 0-No pain                 Aurelio Brash Clarence Dunsmore

## 2020-05-30 NOTE — Consult Note (Signed)
I have seen and examined the patient, labs/ imaging reviewed and discussed  management with Carman Ching. I reviewed the PA's note and agree with the documented findings and plan of care.  Tachycardic with infected appearing urine, worsening right hydronephrosis secondary to obstructing stone status post failed ESWL.  Has been having fevers at home to 101.  Notably, she was seen last week in clinic at which time she grew E. coli in her urine, pansensitive.  Left of the same source.  In the setting of the above clinical picture, recommended urgent ureteral stent placement.  She was last n.p.o. this morning has not eaten or drank in 6+ hours.  Labs are still pending.  She also has a new pericardial effusion which be worked up by hospitalist service will be admitting the patient.  [name], MD      05/30/2020 10:44 AM   [facility] 05/05/1965 [id number]  CC:    Chief Complaint  Patient presents with  . Other    HPI: Carolyn Trevino is a 55 y.o. female s/p ESWL 18 days ago with Dr. Richardo Hanks for management of an 8 mm mid right ureteral calculus who presents today for evaluation of fever and flank pain.  I saw her in clinic most recently 4 days ago for postop follow-up.  She had not yet passed all of her stone at that time and UA was consistent with ongoing stone episode versus UTI, however patient was asymptomatic and vitals are stable at that time.  Since that visit, her urine culture has resulted with pansensitive E. coli and she contacted clinic this morning to report a fever at home.  Today she reports chills and anorexia yesterday with poor sleep overnight.  She awoke this morning with subjective fever.  She took her temperature several times at home, T-max 101 F.  She took 200 mg of ibuprofen this morning around 5:30 AM and is now afebrile in clinic.  She had breakfast this morning around 8:30 AM and is now NPO.  She denies dysuria, urgency, frequency,  nausea, or vomiting.  She underwent stat CT stone study today which revealed worsening right hydronephrosis secondary to a 6 mm stone in the distal right ureter as well as a punctate more proximal right ureteral stone.  No perinephric stranding noted.  Additionally, she has a new small pericardial effusion.  In-office UA today positive for trace intact blood and 2+ leukocyte esterase; urine microscopy with >30 WBCs/HPF.  PMH: History reviewed. No pertinent past medical history.  Surgical History:      Past Surgical History:  Procedure Laterality Date  . ABLATION ON ENDOMETRIOSIS    . ANAL FISSURE REPAIR    . EXTRACORPOREAL SHOCK WAVE LITHOTRIPSY Right 05/12/2020   Procedure: EXTRACORPOREAL SHOCK WAVE LITHOTRIPSY (ESWL);  Surgeon: Sondra Come, MD;  Location: [facility] ORS;  Service: Urology;  Laterality: Right;  . EYE SURGERY    . WISDOM TOOTH EXTRACTION      Home Medications:       Allergies as of 05/30/2020      Reactions   Anesthetics, Amide Nausea And Vomiting   Hydrocodone Nausea Only   Propofol Nausea Only   Wasp Venom Swelling   Wasp Venom Protein    Sulfamethoxazole-trimethoprim Rash         Medication List       Accurate as of May 30, 2020 10:44 AM. If you have any questions, ask your nurse or doctor.  butalbital-acetaminophen-caffeine 50-325-40 MG tablet Commonly known as: FIORICET Take 1 tablet by mouth every 6 (six) hours as needed.   meloxicam 15 MG tablet Commonly known as: MOBIC Take 15 mg by mouth daily.   Multi-Vitamin Gummies Chew   tamsulosin 0.4 MG Caps capsule Commonly known as: FLOMAX Take 1 capsule (0.4 mg total) by mouth daily after supper.   tiZANidine 4 MG tablet Commonly known as: ZANAFLEX tizanidine 4 mg tablet   Vitamin C Gummies 125 MG Chew Generic drug: Ascorbic Acid       Allergies:      Allergies  Allergen Reactions  . Anesthetics, Amide Nausea And Vomiting  .  Hydrocodone Nausea Only  . Propofol Nausea Only  . Wasp Venom Swelling  . Wasp Venom Protein   . Sulfamethoxazole-Trimethoprim Rash    Family History:      Family History  Problem Relation Age of Onset  . Breast cancer Mother 8  . Prostate cancer Father     Social History:   reports that she has never smoked. She has never used smokeless tobacco. No history on file for alcohol use and drug use.  Physical Exam: BP 108/77   Pulse (!) 123   Temp 98 F (36.7 C) (Oral)   Ht 5' (1.524 m)   Wt 154 lb (69.9 kg)   BMI 30.08 kg/m   Constitutional:  Alert and oriented, no acute distress, nontoxic appearing HEENT: Calcium, AT Cardiovascular: No clubbing, cyanosis, or edema Respiratory: Normal respiratory effort, no increased work of breathing Skin: No rashes, bruises or suspicious lesions Neurologic: Grossly intact, no focal deficits, moving all 4 extremities Psychiatric: Normal mood and affect  Laboratory Data: See Epic.  Pertinent Imaging: CT stone study, 05/30/2020: CLINICAL DATA: Right lower quadrant pain, flank pain, lithotripsy 2 weeks ago. Increased urinary frequency.  EXAM: CT ABDOMEN AND PELVIS WITHOUT CONTRAST  TECHNIQUE: Multidetector CT imaging of the abdomen and pelvis was performed following the standard protocol without IV contrast.  COMPARISON: 05/04/2020.  FINDINGS: Lower chest: 1 mm peripheral left lower lobe nodule, likely benign. Heart size normal. New small pericardial effusion. No pleural effusion. Distal esophagus is unremarkable.  Hepatobiliary: Liver and gallbladder are unremarkable. No biliary ductal dilatation.  Pancreas: Negative.  Spleen: Negative.  Adrenals/Urinary Tract: Adrenal glands are unremarkable. Right renal edema with severe right hydronephrosis secondary to a 6 mm stone in the distal right ureter (2/53 and coronal image 56), similar to 05/04/2020. There may be a punctate stone slightly more superiorly within  right ureter (2/51). Distal right ureter is otherwise decompressed. Left kidney is unremarkable. Left ureter is decompressed. Bladder is low in volume.  Stomach/Bowel: Stomach, small bowel, appendix and colon are unremarkable.  Vascular/Lymphatic: Vascular structures are unremarkable. No pathologically enlarged lymph nodes.  Reproductive: Uterus is visualized. No adnexal mass.  Other: No free fluid. Mesenteries and peritoneum are unremarkable.  Musculoskeletal: Minimal degenerative change in the spine.  IMPRESSION: Severe right hydronephrosis secondary to stones measuring up to 6 mm in the distal right ureter. Hydronephrosis has increased in the interval with the largest stone being in similar position to 05/04/2020.  Electronically Signed: By: Leanna Battles M.D. On: 05/30/2020 10:19  ADDENDUM REPORT: 05/30/2020 10:45  ADDENDUM: The following was included in the body of the original report, but was not stated specifically in the impression section:  New small pericardial effusion.   Electronically Signed By: Leanna Battles M.D. On: 05/30/2020 10:45  I personally reviewed the images referenced above and note right hydronephrosis to the level  of a 6 mm and right ureteral stone with a more proximal punctate fragment.  Assessment & Plan:   1. Right ureteral stone 55 year old female s/p ESWL for management of a mid right ureteral stone 18 days ago who now presents with fever in the setting of a retained 6 mm mid right ureteral fragment.  She is afebrile today after taking antipyretics at home, tachycardic, and normotensive.  CT findings notable for new small pericardial effusion.  I explained to the patient that I am concerned for infected retained fragment requiring further intervention in the form of urgent ureteral stenting today.  I explained that she may be admitted for IV antibiotics and that we would plan for definitive stone management at a  later date.  In consultation with Dr. Apolinar Junes, I counseled her to proceed to the emergency department for further evaluation, especially given her findings of pericardial effusion on imaging today.  I contacted the ED to inform them of her impending arrival.  Patient is in agreement with this plan.  I counseled the patient to remain n.p.o.  Notably, she has not received antibiotics for pansensitive E. coli in her urine.  I will defer to ED staff for administration of IV antibiotics in this case - Urinalysis, Complete - CULTURE, URINE COMPREHENSIVE   Return for Proceed to ED for further evaluation.  Carman Ching, PA-C  [facility] [address] [place], [place] [address] [phone]           Electronically signed by Carman Ching, PA-C at 05/30/2020 11:36 AM

## 2020-05-31 ENCOUNTER — Inpatient Hospital Stay
Admit: 2020-05-31 | Discharge: 2020-05-31 | Disposition: A | Discharge status: Home / Self Care | Payer: BC Managed Care – PPO | Attending: Internal Medicine | Admitting: Internal Medicine

## 2020-05-31 LAB — URINALYSIS, COMPLETE
Bilirubin, UA: NEGATIVE
Glucose, UA: NEGATIVE
Ketones, UA: NEGATIVE
Nitrite, UA: NEGATIVE
Protein,UA: NEGATIVE
Specific Gravity, UA: 1.015 (ref 1.005–1.030)
Urobilinogen, Ur: 0.2 mg/dL (ref 0.2–1.0)
pH, UA: 5.5 (ref 5.0–7.5)

## 2020-05-31 LAB — BASIC METABOLIC PANEL
Anion gap: 9 (ref 5–15)
BUN: 16 mg/dL (ref 6–20)
CO2: 24 mmol/L (ref 22–32)
Calcium: 9.7 mg/dL (ref 8.9–10.3)
Chloride: 105 mmol/L (ref 98–111)
Creatinine, Ser: 1.12 mg/dL — ABNORMAL HIGH (ref 0.44–1.00)
GFR, Estimated: 58 mL/min — ABNORMAL LOW (ref >60)
Glucose, Bld: 133 mg/dL — ABNORMAL HIGH (ref 70–99)
Potassium: 4.4 mmol/L (ref 3.5–5.1)
Sodium: 138 mmol/L (ref 135–145)

## 2020-05-31 LAB — CBC
HCT: 33 % — ABNORMAL LOW (ref 36.0–46.0)
Hemoglobin: 11.4 g/dL — ABNORMAL LOW (ref 12.0–15.0)
MCH: 29 pg (ref 26.0–34.0)
MCHC: 34.5 g/dL (ref 30.0–36.0)
MCV: 84 fL (ref 80.0–100.0)
Platelets: 288 10*3/uL (ref 150–400)
RBC: 3.93 MIL/uL (ref 3.87–5.11)
RDW: 12.9 % (ref 11.5–15.5)
WBC: 6.2 10*3/uL (ref 4.0–10.5)
nRBC: 0 % (ref 0.0–0.2)

## 2020-05-31 LAB — ECHOCARDIOGRAM COMPLETE
AR max vel: 1.08 cm2
AV Area VTI: 1.3 cm2
AV Area mean vel: 1.1 cm2
AV Mean grad: 27.7 mmHg
AV Peak grad: 46.7 mmHg
Ao pk vel: 3.42 m/s
Area-P 1/2: 4.65 cm2
Height: 60 in
S' Lateral: 1.25 cm
Weight: 2465.62 oz

## 2020-05-31 LAB — MICROSCOPIC EXAMINATION: WBC, UA: >30 /hpf — AB (ref 0–5)

## 2020-05-31 LAB — LACTIC ACID, PLASMA: Lactic Acid, Venous: 0.9 mmol/L (ref 0.5–1.9)

## 2020-05-31 MED ORDER — CEFUROXIME AXETIL 500 MG PO TABS: 500.0000 mg | ORAL_TABLET | Freq: Two times a day (BID) | ORAL | 0 refills | Status: DC

## 2020-05-31 NOTE — Progress Notes (Signed)
Nsg Discharge Note  Admit Date:  05/30/2020 Discharge date: 05/31/2020   [facility] to be D/C'd Home per MD order.  AVS completed.  Patient/caregiver able to verbalize understanding.  Discharge Medication:   Discharge Assessment: Vitals:   05/31/20 0800 05/31/20 1600  BP: 117/70 118/67  Pulse: 97 81  Resp: 18 18  Temp: 98.9 F (37.2 C) 98.9 F (37.2 C)  SpO2: 93% 97%   Skin clean, dry and intact without evidence of skin break down, no evidence of skin tears noted. IV catheter discontinued intact. Site without signs and symptoms of complications - no redness or edema noted at insertion site, patient denies c/o pain - only slight tenderness at site.  Dressing with slight pressure applied.  D/c Instructions-Education: Discharge instructions given to patient/family with verbalized understanding. D/c education completed with patient/family including follow up instructions, medication list, d/c activities limitations if indicated, with other d/c instructions as indicated by MD - patient able to verbalize understanding, all questions fully answered. Patient instructed to return to ED, call 911, or call MD for any changes in condition.  Patient escorted via WC, and D/C home via private auto.  Theodore Demark, RN 05/31/2020 4:36 PM

## 2020-05-31 NOTE — Progress Notes (Signed)
Urology Inpatient Progress Note  Subjective: No acute events overnight.  She is afebrile, VSS. Creatinine down today, 1.12.  WBC count down today, 6.2. Blood cultures pending with no growth at <24 hours.  On antibiotics as below. Today patient reports some slightly increased RLQ pain, urgency, frequency, and mild gross hematuria immediately following stent placement.  Her discomfort and urinary symptoms have since resolved and she feels well today.  She is not requiring pain medication.  Anti-infectives: Anti-infectives (From admission, onward)   Start     Dose/Rate Route Frequency Ordered Stop   05/30/20 2200  cefTRIAXone (ROCEPHIN) 1 g in sodium chloride 0.9 % 100 mL IVPB        1 g 200 mL/hr over 30 Minutes Intravenous Every 24 hours 05/30/20 1544     05/30/20 1546  levofloxacin (LEVAQUIN) 500 MG/100ML IVPB       Note to Pharmacy: Carma Lair   : cabinet override      05/30/20 1546 05/30/20 1549      Current Facility-Administered Medications  Medication Dose Route Frequency Provider Last Rate Last Admin  . acetaminophen (TYLENOL) tablet 650 mg  650 mg Oral Q6H PRN Lorretta Harp, MD      . butalbital-acetaminophen-caffeine (FIORICET) 50-325-40 MG per tablet 1 tablet  1 tablet Oral Q6H PRN Lorretta Harp, MD      . cefTRIAXone (ROCEPHIN) 1 g in sodium chloride 0.9 % 100 mL IVPB  1 g Intravenous Q24H Lorretta Harp, MD   Stopped at 05/30/20 2140  . morphine 2 MG/ML injection 2 mg  2 mg Intravenous Q3H PRN Lorretta Harp, MD      . multivitamin with minerals tablet 1 tablet  1 tablet Oral Daily Lorretta Harp, MD   1 tablet at 05/30/20 2106  . ondansetron (ZOFRAN) injection 4 mg  4 mg Intravenous Q8H PRN Lorretta Harp, MD      . tamsulosin [facility]) capsule 0.4 mg  0.4 mg Oral QPC supper Lorretta Harp, MD   0.4 mg at 05/30/20 2106  . tiZANidine (ZANAFLEX) tablet 4 mg  4 mg Oral Q8H PRN Lorretta Harp, MD       Objective: Vital signs in last 24 hours: Temp:  [98 F (36.7 C)-99.8 F (37.7 C)] 98.9 F (37.2  C) (03/01 0800) Pulse Rate:  [76-126] 97 (03/01 0800) Resp:  [14-27] 18 (03/01 0800) BP: (101-157)/(43-90) 117/70 (03/01 0800) SpO2:  [93 %-100 %] 93 % (03/01 0800) Weight:  [69.9 kg] 69.9 kg (02/28 1302)  Intake/Output from previous day: 02/28 0701 - 03/01 0700 In: 1660 [P.O.:560; I.V.:900; IV Piggyback:200] Out: 200 [Urine:200] Intake/Output this shift: No intake/output data recorded.  Physical Exam Vitals and nursing note reviewed.  Constitutional:      General: She is not in acute distress.    Appearance: She is not ill-appearing, toxic-appearing or diaphoretic.  HENT:     Head: Normocephalic and atraumatic.  Pulmonary:     Effort: Pulmonary effort is normal. No respiratory distress.  Skin:    General: Skin is warm and dry.  Neurological:     Mental Status: She is alert and oriented to person, place, and time.  Psychiatric:        Mood and Affect: Mood normal.        Behavior: Behavior normal.    Lab Results:  Recent Labs    05/30/20 1336 05/31/20 0345  WBC 8.8 6.2  HGB 11.6* 11.4*  HCT 35.8* 33.0*  PLT 335 288   BMET Recent Labs  05/30/20 1336 05/31/20 0345  NA 137 138  K 3.7 4.4  CL 102 105  CO2 24 24  GLUCOSE 103* 133*  BUN 14 16  CREATININE 1.20* 1.12*  CALCIUM 9.4 9.7   Assessment & Plan: 55 year old female s/p right ureteral stent placement with Dr. Apolinar Junes for management of an infected, obstructing 6 mm mid right ureteral stone following failed ESWL.  Patient is clinically improving and tolerating her stent well today.  Okay for discharge from the urologic perspective today on culture appropriate antibiotics.  She will require a total of 10 to 14 days of therapy for management of her infection.  We discussed that she will require outpatient ureteroscopy with laser lithotripsy and stent exchange in 2 to 3 weeks and patient is in agreement with this plan.  Will complete booking sheet today.  Okay to continue previously prescribed Flomax for  prevention of stent symptoms while outpatient.  Carman Ching, PA-C 05/31/2020

## 2020-05-31 NOTE — Discharge Instructions (Signed)
Advised to follow-up with primary care physician in 1 week. Advised to follow-up with urology in 1 week. Advised to take Ceftin 500 mg twice daily for 10 days.

## 2020-05-31 NOTE — Discharge Summary (Addendum)
Physician Discharge Summary  Carolyn Trevino [facility] ZOX:[id number] DOB: 1965/08/27 DOA: 05/30/2020  PCP: Enid Baas, MD  Admit date: 05/30/2020   Discharge date: 05/31/2020  Admitted From:  Home.  Disposition: Home.  Recommendations for Outpatient Follow-up:  1. Follow up with PCP in 1-2 weeks. 2. Please obtain BMP/CBC in one week. 3. Advised to follow-up with urology Dr. Apolinar Junes in 1 week. 4. Advised to take Ceftin 500 mg twice daily for 10 days.  Home Health: None Equipment/Devices: None  Discharge Condition: Stable CODE STATUS:Full code Diet recommendation: Heart Healthy  Brief [place] [facility] Course: This 55 y.o. female with PMH significant for benign pancreatic lesion, benign left breast mass, kidney stone, who presents in the ED with right flank pain and fever for 2 days. She underwent ESWL 18 days ago with Dr. Richardo Hanks for management of an 8 mm mid right ureteral calculus. Pt has followed up with urology in clinic 4 days ago. Per Dr. Delana Meyer note, she had not yet passed all of her stone at that time. Her urine culture on 2/24 was positive for pansensitive E. Coli. Patient presented in the ED. Pt was seen by urologist Dr. Apolinar Junes of urology and had stat CT stone study which revealed worsening right hydronephrosis secondary to a 6 mm stone in the distal right ureter as well as a punctate more proximal right ureteral stone. CT scan also showed new small pericardial effusion. Pt underwent urgent cystoscopy with right ureteral stent placement by Dr. Apolinar Junes. Patient felt much better after the stent was placed, She reports pain has improved. She wants to be discharged.  Patient was seen by urologist and patient was anxious to be discharged.  Patient is being discharged on Ceftin 500 mg twice daily for next 10 days.  Patient will follow up with urology as an outpatient for definitive treatment for the stone removal.  She was managed for below problems.   Discharge Diagnoses:   Principal Problem:   Ureteral stone Active Problems:   Hydronephrosis due to obstruction of ureter-right side   Acute pyelonephritis   Pericardial effusion  Acute pyelonephritis and ureteral stone and Hydronephrosis due to obstruction of ureter-right side:  Dr. Apolinar Junes of urology consulted.  Patient s/p of cystoscopy with right ureteral stent placement by Dr. Apolinar Junes. -Admitted to MedSurg bed as inpatient -IV Rocephin -Follow-up of blood culture and urine culture -As needed morphine for pain -As needed Zofran for nausea -Urine culture grew pansensitive E. coli blood cultures no growth so far. Patient cleared from urology to be discharged. Patient is being discharged on Ceftin 500 mg twice daily for 10 days.  Pericardial effusion: Incidental finding by CT scan, patient has small pericardial effusion.  Asymptomatic -2D echo: No pericardial effusion noted.    Discharge Instructions  Discharge Instructions    Call MD for:  difficulty breathing, headache or visual disturbances   Complete by: As directed    Call MD for:  persistant dizziness or light-headedness   Complete by: As directed    Call MD for:  persistant nausea and vomiting   Complete by: As directed    Call MD for:  temperature >100.4   Complete by: As directed    Diet - low sodium heart healthy   Complete by: As directed    Diet Carb Modified   Complete by: As directed    Discharge instructions   Complete by: As directed    Advised to follow-up with primary care physician in 1 week. Advised to follow-up with urology in  1 week. Advised to take Ceftin 500 mg twice daily for 10 days.   Increase activity slowly   Complete by: As directed    No wound care   Complete by: As directed      Allergies as of 05/31/2020      Reactions   Anesthetics, Amide Nausea And Vomiting   Hydrocodone Nausea Only   Propofol Nausea Only   Wasp Venom Swelling   Wasp Venom Protein    Sulfamethoxazole-trimethoprim Rash       Medication List    TAKE these medications   butalbital-acetaminophen-caffeine 50-325-40 MG tablet Commonly known as: FIORICET Take 1 tablet by mouth every 6 (six) hours as needed.   cefUROXime 500 MG tablet Commonly known as: CEFTIN Take 1 tablet (500 mg total) by mouth 2 (two) times daily with a meal.   meloxicam 15 MG tablet Commonly known as: MOBIC Take 15 mg by mouth daily.   tamsulosin 0.4 MG Caps capsule Commonly known as: FLOMAX Take 1 capsule (0.4 mg total) by mouth daily after supper.   tiZANidine 4 MG tablet Commonly known as: ZANAFLEX tizanidine 4 mg tablet   Vitamin C Gummies 125 MG Chew Generic drug: Ascorbic Acid       Follow-up Information    [name], MD Follow up in 1 week(s).   Specialty: Urology Contact information: [address] [place] [address] [phone]        Enid Baas, MD Follow up in 1 week(s).   Specialty: Internal Medicine Contact information: [address] [place] [address] [phone]              Allergies  Allergen Reactions  . Anesthetics, Amide Nausea And Vomiting  . Hydrocodone Nausea Only  . Propofol Nausea Only  . Wasp Venom Swelling  . Wasp Venom Protein   . Sulfamethoxazole-Trimethoprim Rash    Consultations:  Urology   Procedures/Studies: DG Abd 1 View  Result Date: 05/27/2020 CLINICAL DATA:  Nephrolithiasis status post ESWL EXAM: ABDOMEN - 1 VIEW COMPARISON:  Abdominal radiograph May 12, 2020 FINDINGS: The bowel gas pattern is normal. No radio-opaque calculi or other significant radiographic abnormality are seen. Pelvic phleboliths. IMPRESSION: No definite renal or ureteral calculi visualized. Electronically Signed   By: Maudry Mayhew MD   On: 05/27/2020 09:44   DG Abd 1 View  Result Date: 05/12/2020 CLINICAL DATA:  Preop evaluation for upcoming surgery EXAM: ABDOMEN - 1 VIEW COMPARISON:  CT from 05/04/2020 FINDINGS: Previously seen right  mid ureteral stone is not as well visualized as on prior CT examination. It is felt the lies superior aspect of the sacrum on the right. Phleboliths are noted within the pelvis. No other focal abnormality is seen. IMPRESSION: Faint calcification is noted over the right sacrum which is felt to correspond with the mid ureteral stone on the right seen on the prior CT. Electronically Signed   By: Alcide Clever M.D.   On: 05/12/2020 08:27   CT ABDOMEN PELVIS W CONTRAST  Result Date: 05/04/2020 CLINICAL DATA:  Patient reports right flank pain for 3 weeks. EXAM: CT ABDOMEN AND PELVIS WITH CONTRAST TECHNIQUE: Multidetector CT imaging of the abdomen and pelvis was performed using the standard protocol following bolus administration of intravenous contrast. CONTRAST:  OMNIPAQUE IOHEXOL 300 MG/ML  SOLN COMPARISON:  None. FINDINGS: Lower chest: The lung bases are clear. No focal airspace disease or pleural fluid. Hepatobiliary: No focal liver abnormality is seen. No gallstones, gallbladder wall thickening, or  biliary dilatation. Pancreas: There is a hypodense lesion within the distal pancreatic tail that measures 10 mm. This is slightly higher density than simple fluid. No pancreatic ductal dilatation or surrounding inflammatory changes. Spleen: Normal in size without focal abnormality. Adrenals/Urinary Tract: Normal adrenal glands. Obstructing 8 x 7 mm stone in the mid right ureter (at the level of S1) with mild proximal hydroureteronephrosis. There is minimal right perinephric edema. Diminished excretion from the right kidney on delayed phase imaging. The ureter distal to the stone is decompressed. No evidence of additional renal calculi. No left hydronephrosis. No focal renal lesion. Partially distended urinary bladder which is unremarkable. Stomach/Bowel: Stomach is within normal limits. Normal positioning of the duodenum and ligament of Treitz. Appendix appears normal. No evidence of bowel wall thickening,  distention, or inflammatory changes. Vascular/Lymphatic: Normal caliber abdominal aorta. Patent portal vein. No acute vascular findings. No enlarged lymph nodes in the abdomen or pelvis. Reproductive: Heterogeneous uterus with fibroids, including a 3.6 cm right fundal fibroid. Left ovary is normal and quiescent. Right ovary is not definitively seen. There is no suspicious adnexal mass. Other: No ascites or focal fluid collection. Musculoskeletal: There are no acute or suspicious osseous abnormalities. IMPRESSION: 1. Obstructing 8 x 7 mm stone in the mid right ureter with mild hydronephrosis. 2. Small 10 mm hypodense lesion in the distal pancreatic tail is nonspecific. This may represent a cyst, however recommend further characterization with pancreatic protocol MRI on an elective basis. 3. Uterine fibroids. These results will be called to the ordering clinician or representative by the Radiologist Assistant, and communication documented in the PACS or [employer]. Electronically Signed   By: Narda Rutherford M.D.   On: 05/04/2020 16:26   MR ABDOMEN MRCP W WO CONTAST  Result Date: 05/20/2020 CLINICAL DATA:  55 year old female with history of flank pain for 1 week. EXAM: MRI ABDOMEN WITHOUT AND WITH CONTRAST (INCLUDING MRCP) TECHNIQUE: Multiplanar multisequence MR imaging of the abdomen was performed both before and after the administration of intravenous contrast. Heavily T2-weighted images of the biliary and pancreatic ducts were obtained, and three-dimensional MRCP images were rendered by post processing. CONTRAST:  7mL GADAVIST GADOBUTROL 1 MMOL/ML IV SOLN COMPARISON:  No prior abdominal MRI. CT the abdomen and pelvis 05/04/2020. FINDINGS: Lower chest: Unremarkable. Hepatobiliary: Subcentimeter T1 hypointense, T2 hyperintense, nonenhancing lesions in the liver, compatible with tiny cysts and/or biliary hamartomas. No other suspicious hepatic lesions. No intra or extrahepatic biliary ductal dilatation.  Gallbladder is normal in appearance. Pancreas: In the tail of the pancreas there is a well-defined T1 hypointense, T2 hyperintense, nonenhancing lesion measuring 1.4 x 1.0 cm (axial image 18 of series 4). This does not appear to communicate with the main pancreatic duct. No other pancreatic mass. No pancreatic ductal dilatation. No peripancreatic fluid collections or inflammatory changes. Spleen:  Unremarkable. Adrenals/Urinary Tract: Moderate right hydroureteronephrosis, similar to prior CT the abdomen and pelvis 05/04/2020. Left kidney and bilateral adrenal glands are normal in appearance. No left hydroureteronephrosis. Stomach/Bowel: The appearance of the stomach and visualized portions of small bowel and colon is unremarkable. Vascular/Lymphatic: No aneurysm identified in the visualized abdominal vasculature. No lymphadenopathy noted in the abdomen. Other: No significant volume of ascites noted in the visualized portions of the peritoneal cavity. Musculoskeletal: No aggressive appearing osseous lesions are noted in the visualized portions of the skeleton. IMPRESSION: 1. 1.4 x 1.0 cm cystic lesion in the tail of the pancreas is favored to represent a small benign lesion such as a pancreatic pseudocyst. Recommend follow  up pre and post contrast MRI/MRCP or pancreatic protocol CT in 1 year. This recommendation follows ACR consensus guidelines: Management of Incidental Pancreatic Cysts: A White Paper of the ACR Incidental Findings Committee. J Am Coll Radiol 2017;14:911-923. 2. Moderate right hydroureteronephrosis, similar to prior CT the abdomen and pelvis 05/04/2020. 3. Additional incidental findings, as above. Electronically Signed   By: Trudie Reed M.D.   On: 05/20/2020 11:53   DG OR UROLOGY CYSTO IMAGE ([facility] ONLY)  Result Date: 05/30/2020 There is no interpretation for this exam.  This order is for images obtained during a surgical procedure.  Please See "Surgeries" Tab for more information regarding  the procedure.   US BREAST LTD UNI LEFT INC AXILLA  Result Date: 05/23/2020 CLINICAL DATA:  Patient returns today to evaluate a possible LEFT breast mass identified on recent screening mammogram. EXAM: DIGITAL DIAGNOSTIC UNILATERAL LEFT MAMMOGRAM WITH TOMOSYNTHESIS AND CAD; ULTRASOUND LEFT BREAST LIMITED TECHNIQUE: Left digital diagnostic mammography and breast tomosynthesis was performed. The images were evaluated with computer-aided detection.; Targeted ultrasound examination of the left breast was performed COMPARISON:  Recent screening mammogram dated 05/06/2020. ACR Breast Density Category b: There are scattered areas of fibroglandular density. FINDINGS: Oval circumscribed low-density mass is confirmed within the outer LEFT breast, at middle to posterior depth, measuring 5 mm greatest dimension. Smaller abutting partially obscured masses are present, each measuring approximately 3-4 mm in size. This may represent a benign cluster of cysts. Ultrasound will be performed for further characterization. Targeted ultrasound is performed, evaluating the entire outer LEFT breast, showing an oval hypoechoic area at the 2 o'clock axis, 8 cm from the nipple, measuring 1 x 0.2 x 0.5 cm, most suggestive of a cluster of complicated cysts or apocrine metaplasia during real-time ultrasound evaluation. Additional small benign cyst is identified in the LEFT breast at the 1 o'clock axis. No suspicious solid or cystic mass is seen within the outer LEFT breast. IMPRESSION: Probably benign cluster of complicated cysts versus apocrine metaplasia within the LEFT breast at the 2 o'clock axis, 8 cm from the nipple, measuring 1 x 0.2 x 0.5 cm, corresponding to the mammographic finding. Recommend follow-up LEFT breast diagnostic mammogram and ultrasound in 6 months to ensure stability. RECOMMENDATION: LEFT breast diagnostic mammogram and ultrasound in 6 months. I have discussed the findings and recommendations with the patient. If  applicable, a reminder letter will be sent to the patient regarding the next appointment. BI-RADS CATEGORY  3: Probably benign. Electronically Signed   By: Bary Richard M.D.   On: 05/23/2020 10:06   MM DIAG BREAST TOMO UNI LEFT  Result Date: 05/23/2020 CLINICAL DATA:  Patient returns today to evaluate a possible LEFT breast mass identified on recent screening mammogram. EXAM: DIGITAL DIAGNOSTIC UNILATERAL LEFT MAMMOGRAM WITH TOMOSYNTHESIS AND CAD; ULTRASOUND LEFT BREAST LIMITED TECHNIQUE: Left digital diagnostic mammography and breast tomosynthesis was performed. The images were evaluated with computer-aided detection.; Targeted ultrasound examination of the left breast was performed COMPARISON:  Recent screening mammogram dated 05/06/2020. ACR Breast Density Category b: There are scattered areas of fibroglandular density. FINDINGS: Oval circumscribed low-density mass is confirmed within the outer LEFT breast, at middle to posterior depth, measuring 5 mm greatest dimension. Smaller abutting partially obscured masses are present, each measuring approximately 3-4 mm in size. This may represent a benign cluster of cysts. Ultrasound will be performed for further characterization. Targeted ultrasound is performed, evaluating the entire outer LEFT breast, showing an oval hypoechoic area at the 2 o'clock axis, 8 cm from the  nipple, measuring 1 x 0.2 x 0.5 cm, most suggestive of a cluster of complicated cysts or apocrine metaplasia during real-time ultrasound evaluation. Additional small benign cyst is identified in the LEFT breast at the 1 o'clock axis. No suspicious solid or cystic mass is seen within the outer LEFT breast. IMPRESSION: Probably benign cluster of complicated cysts versus apocrine metaplasia within the LEFT breast at the 2 o'clock axis, 8 cm from the nipple, measuring 1 x 0.2 x 0.5 cm, corresponding to the mammographic finding. Recommend follow-up LEFT breast diagnostic mammogram and ultrasound in 6  months to ensure stability. RECOMMENDATION: LEFT breast diagnostic mammogram and ultrasound in 6 months. I have discussed the findings and recommendations with the patient. If applicable, a reminder letter will be sent to the patient regarding the next appointment. BI-RADS CATEGORY  3: Probably benign. Electronically Signed   By: Stan  Maynard M.D. Bary Richard1/2022 10:06   MM 3D SCREEN BREAST BILATERAL  Result Date: 05/09/2020 CLINICAL DATA:  Screening. EXAM: DIGITAL SCREENING BILATERAL MAMMOGRAM WITH TOMOSYNTHESIS AND CAD TECHNIQUE: Bilateral screening digital craniocaudal and mediolateral oblique mammograms were obtained. Bilateral screening digital breast tomosynthesis was performed. The images were evaluated with computer-aided detection. COMPARISON:  None. ACR Breast Density Category b: There are scattered areas of fibroglandular density. FINDINGS: In the left breast, a possible mass warrants further evaluation. In the right breast, no findings suspicious for malignancy. The images were evaluated with computer-aided detection. IMPRESSION: Further evaluation is suggested for a possible mass in the left breast. RECOMMENDATION: Diagnostic mammogram and possibly ultrasound of the left breast. (Code:[id number]) The patient will be contacted regarding the findings, and additional imaging will be scheduled. BI-RADS CATEGORY  0: Incomplete. Need additional imaging evaluation and/or prior mammograms for comparison. Electronically Signed   By: Baird Lyons M.D.   On: 05/09/2020 14:36   ECHOCARDIOGRAM COMPLETE  Result Date: 05/31/2020    ECHOCARDIOGRAM REPORT   Patient Name:   Carolyn Trevino Date of Exam: 05/31/2020 Medical Rec #:  [id number]         Height:       60.0 in Accession #:    [id number]        Weight:       154.1 lb Date of Birth:  Apr 10, 1965         BSA:          1.671 m Patient Age:    54 years          BP:           117/70 mmHg Patient Gender: F                 HR:           97 bpm. Exam Location:  [facility]  Procedure: 2D Echo, Cardiac Doppler, Color Doppler and Strain Analysis Indications:     Pericardial effusion I31.3  History:         Patient has no prior history of Echocardiogram examinations.                  Pericardial effusion.  Sonographer:     Cristela Blue RDCS (AE) Referring Phys:  Kern Reap Brien Few NIU Diagnosing Phys: Adrian Blackwater MD  Sonographer Comments: Global longitudinal strain was attempted. IMPRESSIONS  1. Left ventricular ejection fraction, by estimation, is 50 to 55%. The left ventricle has low normal function. The left ventricle demonstrates global hypokinesis. There is mild left ventricular hypertrophy. Left ventricular diastolic parameters are consistent with Grade I  diastolic dysfunction (impaired relaxation).  2. Right ventricular systolic function is normal. The right ventricular size is mildly enlarged. Mildly increased right ventricular wall thickness.  3. Left atrial size was mildly dilated.  4. Right atrial size was mildly dilated.  5. The mitral valve is normal in structure. Mild mitral valve regurgitation. No evidence of mitral stenosis.  6. The aortic valve is normal in structure. Aortic valve regurgitation is not visualized. Mild aortic valve sclerosis is present, with no evidence of aortic valve stenosis.  7. The inferior vena cava is normal in size with greater than 50% respiratory variability, suggesting right atrial pressure of 3 mmHg. FINDINGS  Left Ventricle: Left ventricular ejection fraction, by estimation, is 50 to 55%. The left ventricle has low normal function. The left ventricle demonstrates global hypokinesis. The left ventricular internal cavity size was normal in size. There is mild left ventricular hypertrophy. Left ventricular diastolic parameters are consistent with Grade I diastolic dysfunction (impaired relaxation). Right Ventricle: The right ventricular size is mildly enlarged. Mildly increased right ventricular wall thickness. Right ventricular systolic function is  normal. Left Atrium: Left atrial size was mildly dilated. Right Atrium: Right atrial size was mildly dilated. Pericardium: There is no evidence of pericardial effusion. Mitral Valve: The mitral valve is normal in structure. Mild mitral valve regurgitation. No evidence of mitral valve stenosis. Tricuspid Valve: The tricuspid valve is normal in structure. Tricuspid valve regurgitation is mild . No evidence of tricuspid stenosis. Aortic Valve: The aortic valve is normal in structure. Aortic valve regurgitation is not visualized. Mild aortic valve sclerosis is present, with no evidence of aortic valve stenosis. Aortic valve mean gradient measures 27.7 mmHg. Aortic valve peak gradient measures 46.7 mmHg. Aortic valve area, by VTI measures 1.30 cm. Pulmonic Valve: The pulmonic valve was normal in structure. Pulmonic valve regurgitation is trivial. No evidence of pulmonic stenosis. Aorta: The aortic root is normal in size and structure. Venous: The inferior vena cava is normal in size with greater than 50% respiratory variability, suggesting right atrial pressure of 3 mmHg. IAS/Shunts: No atrial level shunt detected by color flow Doppler.  LEFT VENTRICLE PLAX 2D LVIDd:         1.67 cm  Diastology LVIDs:         1.25 cm  LV e' medial:    7.83 cm/s LV PW:         0.59 cm  LV E/e' medial:  10.6 LV IVS:        3.39 cm  LV e' lateral:   7.72 cm/s LVOT diam:     2.20 cm  LV E/e' lateral: 10.7 LV SV:         80 LV SV Index:   48 LVOT Area:     3.80 cm                          3D Volume EF:                         3D EF:        60 %                         LV EDV:       70 ml                         LV ESV:  28 ml                         LV SV:        42 ml RIGHT VENTRICLE RV Basal diam:  2.91 cm RV S prime:     22.00 cm/s TAPSE (M-mode): 4.2 cm LEFT ATRIUM             Index       RIGHT ATRIUM           Index LA diam:        2.90 cm 1.74 cm/m  RA Area:     13.90 cm LA Vol (A2C):   49.1 ml 29.39 ml/m RA Volume:   34.70 ml   20.77 ml/m LA Vol (A4C):   22.0 ml 13.17 ml/m LA Biplane Vol: 33.2 ml 19.87 ml/m  AORTIC VALVE                    PULMONIC VALVE AV Area (Vmax):    1.08 cm     PV Vmax:        0.86 m/s AV Area (Vmean):   1.10 cm     PV Peak grad:   3.0 mmHg AV Area (VTI):     1.30 cm     RVOT Peak grad: 5 mmHg AV Vmax:           341.67 cm/s AV Vmean:          243.000 cm/s AV VTI:            0.619 m AV Peak Grad:      46.7 mmHg AV Mean Grad:      27.7 mmHg LVOT Vmax:         97.30 cm/s LVOT Vmean:        70.500 cm/s LVOT VTI:          0.211 m LVOT/AV VTI ratio: 0.34  AORTA Ao Root diam: 2.93 cm MITRAL VALVE                TRICUSPID VALVE MV Area (PHT): 4.65 cm     TR Peak grad:   13.1 mmHg MV Decel Time: 163 msec     TR Vmax:        181.00 cm/s MV E velocity: 82.70 cm/s MV A velocity: 105.00 cm/s  SHUNTS MV E/A ratio:  0.79         Systemic VTI:  0.21 m                             Systemic Diam: 2.20 cm Adrian BlackwaterShaukat Khan MD Electronically signed by Adrian BlackwaterShaukat Khan MD Signature Date/Time: 05/31/2020/3:29:48 PM    Final    CT RENAL STONE STUDY  Addendum Date: 05/30/2020   ADDENDUM REPORT: 05/30/2020 10:45 ADDENDUM: The following was included in the body of the original report, but was not stated specifically in the impression section: New small pericardial effusion. Electronically Signed   By: Leanna BattlesMelinda  Blietz M.D.   On: 05/30/2020 10:45   Result Date: 05/30/2020 CLINICAL DATA:  Right lower quadrant pain, flank pain, lithotripsy 2 weeks ago. Increased urinary frequency. EXAM: CT ABDOMEN AND PELVIS WITHOUT CONTRAST TECHNIQUE: Multidetector CT imaging of the abdomen and pelvis was performed following the standard protocol without IV contrast. COMPARISON:  05/04/2020. FINDINGS: Lower chest: 1 mm peripheral left lower lobe nodule, likely benign. Heart size normal. New small pericardial effusion. No  pleural effusion. Distal esophagus is unremarkable. Hepatobiliary: Liver and gallbladder are unremarkable. No biliary ductal dilatation.  Pancreas: Negative. Spleen: Negative. Adrenals/Urinary Tract: Adrenal glands are unremarkable. Right renal edema with severe right hydronephrosis secondary to a 6 mm stone in the distal right ureter (2/53 and coronal image 56), similar to 05/04/2020. There may be a punctate stone slightly more superiorly within right ureter (2/51). Distal right ureter is otherwise decompressed. Left kidney is unremarkable. Left ureter is decompressed. Bladder is low in volume. Stomach/Bowel: Stomach, small bowel, appendix and colon are unremarkable. Vascular/Lymphatic: Vascular structures are unremarkable. No pathologically enlarged lymph nodes. Reproductive: Uterus is visualized.  No adnexal mass. Other: No free fluid.  Mesenteries and peritoneum are unremarkable. Musculoskeletal: Minimal degenerative change in the spine. IMPRESSION: Severe right hydronephrosis secondary to stones measuring up to 6 mm in the distal right ureter. Hydronephrosis has increased in the interval with the largest stone being in similar position to 05/04/2020. Electronically Signed: By: Leanna Battles M.D. On: 05/30/2020 10:19    Echo , Cystoscopy with stent placement.   Subjective: Patient was seen and examined at bedside.  Overnight events noted.   Patient feels much improved.  Patient is cleared from urology.  Patient can be discharged on Ceftin twice daily for 10 days.  Discharge Exam: Vitals:   05/31/20 0800 05/31/20 1600  BP: 117/70 118/67  Pulse: 97 81  Resp: 18 18  Temp: 98.9 F (37.2 C) 98.9 F (37.2 C)  SpO2: 93% 97%   Vitals:   05/30/20 2000 05/31/20 0400 05/31/20 0800 05/31/20 1600  BP: 120/75 111/62 117/70 118/67  Pulse: 86 76 97 81  Resp: Temp: 98 F (36.7 C) 98.5 F (36.9 C) 98.9 F (37.2 C) 98.9 F (37.2 C)  TempSrc: Temporal Temporal Temporal Temporal  SpO2: 97% 95% 93% 97%  Weight:      Height:        General: Pt is alert, awake, not in acute distress Cardiovascular: RRR, S1/S2 +, no  rubs, no gallops Respiratory: CTA bilaterally, no wheezing, no rhonchi Abdominal: Soft, NT, ND, bowel sounds + Extremities: no edema, no cyanosis    The results of significant diagnostics from this hospitalization (including imaging, microbiology, ancillary and laboratory) are listed below for reference.     Microbiology: Recent Results (from the past 240 hour(s))  Microscopic Examination     Status: Abnormal   Collection Time: 05/26/20  9:13 AM   Urine  Result Value Ref Range Status   WBC, UA >30 (A) 0 - 5 /hpf Final   RBC 3-10 (A) 0 - 2 /hpf Final   Epithelial Cells (non renal) 0-10 0 - 10 /hpf Final   Renal Epithel, UA 0-10 (A) None seen /hpf Final   Casts Present (A) None seen /lpf Final   Cast Type Hyaline casts N/A Final   Bacteria, UA Few None seen/Few Final  CULTURE, URINE COMPREHENSIVE     Status: Abnormal   Collection Time: 05/26/20  9:52 AM   Specimen: Urine   UR  Result Value Ref Range Status   Urine Culture, Comprehensive Final report (A)  Final   Organism ID, Bacteria Escherichia coli (A)  Final    Comment: Cefazolin <=4 ug/mL Cefazolin with an MIC <=16 predicts susceptibility to the oral agents cefaclor, cefdinir, cefpodoxime, cefprozil, cefuroxime, cephalexin, and loracarbef when used for therapy of uncomplicated urinary tract infections due to E. coli, Klebsiella pneumoniae, and Proteus mirabilis. 50,000-100,000 colony forming units per mL  Organism ID, Bacteria Comment  Final    Comment: Mixed urogenital flora 2,000 Colonies/mL    ANTIMICROBIAL SUSCEPTIBILITY Comment  Final    Comment:       ** S = Susceptible; I = Intermediate; R = Resistant **                    P = Positive; N = Negative             MICS are expressed in micrograms per mL    Antibiotic                 RSLT#1    RSLT#2    RSLT#3    RSLT#4 Amoxicillin/Clavulanic Acid    S Ampicillin                     S Cefepime                       S Ceftriaxone                     S Cefuroxime                     S Ciprofloxacin                  S Ertapenem                      S Gentamicin                     S Imipenem                       S Levofloxacin                   S Meropenem                      S Nitrofurantoin                 S Piperacillin/Tazobactam        S Tetracycline                   S Tobramycin                     S Trimethoprim/Sulfa             S   Microscopic Examination     Status: Abnormal   Collection Time: 05/30/20 10:54 AM   Urine  Result Value Ref Range Status   WBC, UA >30 (A) 0 - 5 /hpf Final   RBC 0-2 0 - 2 /hpf Final   Epithelial Cells (non renal) 0-10 0 - 10 /hpf Final   Casts Present (A) None seen /lpf Final   Cast Type Hyaline casts N/A Final   Bacteria, UA Few None seen/Few Final  Resp Panel by RT-PCR (Flu A&B, Covid) Nasopharyngeal Swab     Status: None   Collection Time: 05/30/20 11:31 AM   Specimen: Nasopharyngeal Swab; Nasopharyngeal(NP) swabs in vial transport medium  Result Value Ref Range Status   SARS Coronavirus 2 by RT PCR NEGATIVE NEGATIVE Final    Comment: (NOTE) SARS-CoV-2 target nucleic acids are NOT DETECTED.  The SARS-CoV-2 RNA is generally detectable in upper respiratory specimens during the acute phase of infection. The lowest concentration of SARS-CoV-2 viral  copies this assay can detect is 138 copies/mL. A negative result does not preclude SARS-Cov-2 infection and should not be used as the sole basis for treatment or other patient management decisions. A negative result may occur with  improper specimen collection/handling, submission of specimen other than nasopharyngeal swab, presence of viral mutation(s) within the areas targeted by this assay, and inadequate number of viral copies(<138 copies/mL). A negative result must be combined with clinical observations, patient history, and epidemiological information. The expected result is Negative.  Fact Sheet for Patients:   BloggerCourse.com  Fact Sheet for Healthcare Providers:  SeriousBroker.it  This test is no t yet approved or cleared by the [place] FDA and  has been authorized for detection and/or diagnosis of SARS-CoV-2 by FDA under an Emergency Use Authorization (EUA). This EUA will remain  in effect (meaning this test can be used) for the duration of the COVID-19 declaration under Section 564(b)(1) of the Act, 21 U.S.C.section 360bbb-3(b)(1), unless the authorization is terminated  or revoked sooner.       Influenza A by PCR NEGATIVE NEGATIVE Final   Influenza B by PCR NEGATIVE NEGATIVE Final    Comment: (NOTE) The Xpert Xpress SARS-CoV-2/FLU/RSV plus assay is intended as an aid in the diagnosis of influenza from Nasopharyngeal swab specimens and should not be used as a sole basis for treatment. Nasal washings and aspirates are unacceptable for Xpert Xpress SARS-CoV-2/FLU/RSV testing.  Fact Sheet for Patients: BloggerCourse.com  Fact Sheet for Healthcare Providers: SeriousBroker.it  This test is not yet approved or cleared by the [place] FDA and has been authorized for detection and/or diagnosis of SARS-CoV-2 by FDA under an Emergency Use Authorization (EUA). This EUA will remain in effect (meaning this test can be used) for the duration of the COVID-19 declaration under Section 564(b)(1) of the Act, 21 U.S.C. section 360bbb-3(b)(1), unless the authorization is terminated or revoked.  Performed at [facility], [address] [place], [place] [address]   Culture, blood (routine x 2)     Status: None (Preliminary result)   Collection Time: 05/30/20  4:38 PM   Specimen: BLOOD  Result Value Ref Range Status   Specimen Description BLOOD RIGHT ANTECUBITAL  Final   Special Requests   Final    BOTTLES DRAWN AEROBIC AND ANAEROBIC Blood Culture adequate volume    Culture   Final    NO GROWTH < 24 HOURS Performed at [facility], [address] [place], [place] [address]    Report Status PENDING  Incomplete  Culture, blood (routine x 2)     Status: None (Preliminary result)   Collection Time: 05/30/20  4:55 PM   Specimen: BLOOD  Result Value Ref Range Status   Specimen Description BLOOD BLOOD RIGHT HAND  Final   Special Requests   Final    BOTTLES DRAWN AEROBIC AND ANAEROBIC Blood Culture adequate volume   Culture   Final    NO GROWTH < 24 HOURS Performed at [facility], [address], [place], [place] [address]    Report Status PENDING  Incomplete     Labs: BNP (last 3 results) Recent Labs    05/30/20 1336  BNP 27.6   Basic Metabolic Panel: Recent Labs  Lab 05/30/20 1336 05/31/20 0345  NA 137 138  K 3.7 4.4  CL 102 105  CO2 24 24  GLUCOSE 103* 133*  BUN 14 16  CREATININE 1.20* 1.12*  CALCIUM 9.4 9.7   Liver Function Tests: Recent Labs  Lab 05/30/20 1336  AST 21  ALT 20  ALKPHOS 101  BILITOT 0.8  PROT 8.1  ALBUMIN 3.9   No results for input(s): LIPASE, AMYLASE in the last 168 hours. No results for input(s): AMMONIA in the last 168 hours. CBC: Recent Labs  Lab 05/30/20 1336 05/31/20 0345  WBC 8.8 6.2  NEUTROABS 6.8  --   HGB 11.6* 11.4*  HCT 35.8* 33.0*  MCV 85.4 84.0  PLT 335 288   Cardiac Enzymes: No results for input(s): CKTOTAL, CKMB, CKMBINDEX, TROPONINI in the last 168 hours. BNP: Invalid input(s): POCBNP CBG: No results for input(s): GLUCAP in the last 168 hours. D-Dimer No results for input(s): DDIMER in the last 72 hours. Hgb A1c No results for input(s): HGBA1C in the last 72 hours. Lipid Profile No results for input(s): CHOL, HDL, LDLCALC, TRIG, CHOLHDL, LDLDIRECT in the last 72 hours. Thyroid function studies No results for input(s): TSH, T4TOTAL, T3FREE, THYROIDAB in the last 72 hours.  Invalid input(s): FREET3 Anemia work up No results for input(s): VITAMINB12,  FOLATE, FERRITIN, TIBC, IRON, RETICCTPCT in the last 72 hours. Urinalysis    Component Value Date/Time   APPEARANCEUR Clear 05/30/2020 1054   GLUCOSEU Negative 05/30/2020 1054   BILIRUBINUR Negative 05/30/2020 1054   PROTEINUR Negative 05/30/2020 1054   NITRITE Negative 05/30/2020 1054   LEUKOCYTESUR 2+ (A) 05/30/2020 1054   Sepsis Labs Invalid input(s): PROCALCITONIN,  WBC,  LACTICIDVEN Microbiology Recent Results (from the past 240 hour(s))  Microscopic Examination     Status: Abnormal   Collection Time: 05/26/20  9:13 AM   Urine  Result Value Ref Range Status   WBC, UA >30 (A) 0 - 5 /hpf Final   RBC 3-10 (A) 0 - 2 /hpf Final   Epithelial Cells (non renal) 0-10 0 - 10 /hpf Final   Renal Epithel, UA 0-10 (A) None seen /hpf Final   Casts Present (A) None seen /lpf Final   Cast Type Hyaline casts N/A Final   Bacteria, UA Few None seen/Few Final  CULTURE, URINE COMPREHENSIVE     Status: Abnormal   Collection Time: 05/26/20  9:52 AM   Specimen: Urine   UR  Result Value Ref Range Status   Urine Culture, Comprehensive Final report (A)  Final   Organism ID, Bacteria Escherichia coli (A)  Final    Comment: Cefazolin <=4 ug/mL Cefazolin with an MIC <=16 predicts susceptibility to the oral agents cefaclor, cefdinir, cefpodoxime, cefprozil, cefuroxime, cephalexin, and loracarbef when used for therapy of uncomplicated urinary tract infections due to E. coli, Klebsiella pneumoniae, and Proteus mirabilis. 50,000-100,000 colony forming units per mL    Organism ID, Bacteria Comment  Final    Comment: Mixed urogenital flora 2,000 Colonies/mL    ANTIMICROBIAL SUSCEPTIBILITY Comment  Final    Comment:       ** S = Susceptible; I = Intermediate; R = Resistant **                    P = Positive; N = Negative             MICS are expressed in micrograms per mL    Antibiotic                 RSLT#1    RSLT#2    RSLT#3    RSLT#4 Amoxicillin/Clavulanic Acid    S Ampicillin  S Cefepime                       S Ceftriaxone                    S Cefuroxime                     S Ciprofloxacin                  S Ertapenem                      S Gentamicin                     S Imipenem                       S Levofloxacin                   S Meropenem                      S Nitrofurantoin                 S Piperacillin/Tazobactam        S Tetracycline                   S Tobramycin                     S Trimethoprim/Sulfa             S   Microscopic Examination     Status: Abnormal   Collection Time: 05/30/20 10:54 AM   Urine  Result Value Ref Range Status   WBC, UA >30 (A) 0 - 5 /hpf Final   RBC 0-2 0 - 2 /hpf Final   Epithelial Cells (non renal) 0-10 0 - 10 /hpf Final   Casts Present (A) None seen /lpf Final   Cast Type Hyaline casts N/A Final   Bacteria, UA Few None seen/Few Final  Resp Panel by RT-PCR (Flu A&B, Covid) Nasopharyngeal Swab     Status: None   Collection Time: 05/30/20 11:31 AM   Specimen: Nasopharyngeal Swab; Nasopharyngeal(NP) swabs in vial transport medium  Result Value Ref Range Status   SARS Coronavirus 2 by RT PCR NEGATIVE NEGATIVE Final    Comment: (NOTE) SARS-CoV-2 target nucleic acids are NOT DETECTED.  The SARS-CoV-2 RNA is generally detectable in upper respiratory specimens during the acute phase of infection. The lowest concentration of SARS-CoV-2 viral copies this assay can detect is 138 copies/mL. A negative result does not preclude SARS-Cov-2 infection and should not be used as the sole basis for treatment or other patient management decisions. A negative result may occur with  improper specimen collection/handling, submission of specimen other than nasopharyngeal swab, presence of viral mutation(s) within the areas targeted by this assay, and inadequate number of viral copies(<138 copies/mL). A negative result must be combined with clinical observations, patient history, and epidemiological information. The  expected result is Negative.  Fact Sheet for Patients:  BloggerCourse.com  Fact Sheet for Healthcare Providers:  SeriousBroker.it  This test is no t yet approved or cleared by the [place] FDA and  has been authorized for detection and/or diagnosis of SARS-CoV-2 by FDA under an Emergency Use Authorization (EUA). This EUA will remain  in effect (meaning this test can be used)  for the duration of the COVID-19 declaration under Section 564(b)(1) of the Act, 21 U.S.C.section 360bbb-3(b)(1), unless the authorization is terminated  or revoked sooner.       Influenza A by PCR NEGATIVE NEGATIVE Final   Influenza B by PCR NEGATIVE NEGATIVE Final    Comment: (NOTE) The Xpert Xpress SARS-CoV-2/FLU/RSV plus assay is intended as an aid in the diagnosis of influenza from Nasopharyngeal swab specimens and should not be used as a sole basis for treatment. Nasal washings and aspirates are unacceptable for Xpert Xpress SARS-CoV-2/FLU/RSV testing.  Fact Sheet for Patients: BloggerCourse.com  Fact Sheet for Healthcare Providers: SeriousBroker.it  This test is not yet approved or cleared by the [place] FDA and has been authorized for detection and/or diagnosis of SARS-CoV-2 by FDA under an Emergency Use Authorization (EUA). This EUA will remain in effect (meaning this test can be used) for the duration of the COVID-19 declaration under Section 564(b)(1) of the Act, 21 U.S.C. section 360bbb-3(b)(1), unless the authorization is terminated or revoked.  Performed at [facility], [address] [place], [place] [address]   Culture, blood (routine x 2)     Status: None (Preliminary result)   Collection Time: 05/30/20  4:38 PM   Specimen: BLOOD  Result Value Ref Range Status   Specimen Description BLOOD RIGHT ANTECUBITAL  Final   Special Requests   Final    BOTTLES DRAWN  AEROBIC AND ANAEROBIC Blood Culture adequate volume   Culture   Final    NO GROWTH < 24 HOURS Performed at [facility], [address] [place], [place] [address]    Report Status PENDING  Incomplete  Culture, blood (routine x 2)     Status: None (Preliminary result)   Collection Time: 05/30/20  4:55 PM   Specimen: BLOOD  Result Value Ref Range Status   Specimen Description BLOOD BLOOD RIGHT HAND  Final   Special Requests   Final    BOTTLES DRAWN AEROBIC AND ANAEROBIC Blood Culture adequate volume   Culture   Final    NO GROWTH < 24 HOURS Performed at [facility], [address], [place], [place] [address]    Report Status PENDING  Incomplete     Time coordinating discharge: Over 30 minutes  SIGNED:   Cipriano Bunker, MD  [facility] 05/31/2020, 4:46 PM Pager   If 7PM-7AM, please contact night-coverage [email or web address]

## 2020-05-31 NOTE — Progress Notes (Signed)
*  PRELIMINARY RESULTS* Echocardiogram 2D Echocardiogram has been performed.  Carolyn Trevino 05/31/2020, 10:19 AM

## 2020-06-01 ENCOUNTER — Other Ambulatory Visit: Payer: Self-pay | Admitting: Radiology

## 2020-06-01 DIAGNOSIS — N201 Calculus of ureter: Secondary | ICD-10-CM

## 2020-06-01 LAB — CALCULI, WITH PHOTOGRAPH (CLINICAL LAB)
Calcium Oxalate Dihydrate: 90 %
Calcium Oxalate Monohydrate: 10 %
Weight Calculi: 5 mg

## 2020-06-02 LAB — CULTURE, URINE COMPREHENSIVE

## 2020-06-04 LAB — CULTURE, BLOOD (ROUTINE X 2)
Culture: NO GROWTH
Culture: NO GROWTH
Special Requests: ADEQUATE
Special Requests: ADEQUATE

## 2020-06-08 ENCOUNTER — Telehealth: Payer: Self-pay | Admitting: Urology

## 2020-06-08 ENCOUNTER — Other Ambulatory Visit: Payer: Self-pay | Admitting: Physician Assistant

## 2020-06-08 DIAGNOSIS — N201 Calculus of ureter: Secondary | ICD-10-CM

## 2020-06-08 NOTE — Telephone Encounter (Signed)
Pt needs refill of flomax, states she only has 3 pills left. Please advise.

## 2020-06-09 ENCOUNTER — Ambulatory Visit: Payer: BC Managed Care – PPO | Admitting: Physician Assistant

## 2020-06-10 ENCOUNTER — Other Ambulatory Visit: Payer: Self-pay

## 2020-06-10 ENCOUNTER — Other Ambulatory Visit: Payer: BC Managed Care – PPO

## 2020-06-10 DIAGNOSIS — N201 Calculus of ureter: Secondary | ICD-10-CM

## 2020-06-10 LAB — URINALYSIS, COMPLETE
Bilirubin, UA: NEGATIVE
Glucose, UA: NEGATIVE
Ketones, UA: NEGATIVE
Nitrite, UA: NEGATIVE
Specific Gravity, UA: 1.02 (ref 1.005–1.030)
Urobilinogen, Ur: 0.2 mg/dL (ref 0.2–1.0)
pH, UA: 7 (ref 5.0–7.5)

## 2020-06-10 LAB — MICROSCOPIC EXAMINATION
Bacteria, UA: NONE SEEN
RBC, Urine: >30 /hpf — AB (ref 0–2)

## 2020-06-10 MED ORDER — TAMSULOSIN HCL 0.4 MG PO CAPS: 0.4000 mg | ORAL_CAPSULE | Freq: Every day | ORAL | 0 refills | Status: DC

## 2020-06-13 ENCOUNTER — Encounter
Admission: RE | Admit: 2020-06-13 | Discharge: 2020-06-13 | Disposition: A | Discharge status: Home / Self Care | Payer: BC Managed Care – PPO | Source: Ambulatory Visit | Attending: Urology | Admitting: Urology

## 2020-06-13 ENCOUNTER — Other Ambulatory Visit: Payer: Self-pay

## 2020-06-13 HISTORY — DX: Personal history of urinary calculi: Z87.442

## 2020-06-13 HISTORY — DX: Other specified postprocedural states: R11.2

## 2020-06-13 HISTORY — DX: Other specified postprocedural states: Z98.890

## 2020-06-13 NOTE — Patient Instructions (Signed)
Your procedure is scheduled on: 06/20/20 Report to DAY SURGERY DEPARTMENT LOCATED ON 2ND FLOOR MEDICAL MALL ENTRANCE. To find out your arrival time please call [phone] between 1PM - 3PM on 06/17/20.  Remember: Instructions that are not followed completely may result in serious medical risk, up to and including death, or upon the discretion of your surgeon and anesthesiologist your surgery may need to be rescheduled.     _X__ 1. Do not eat food after midnight the night before your procedure.                 No gum chewing or hard candies. You may drink clear liquids up to 2 hours                 before you are scheduled to arrive for your surgery- DO not drink clear                 liquids within 2 hours of the start of your surgery.                 Clear Liquids include:  water, apple juice without pulp, clear carbohydrate                 drink such as Clearfast or Gatorade, Ringle Coffee or Tea (Do not add                 anything to coffee or tea). Diabetics water only  __X__2.  On the morning of surgery brush your teeth with toothpaste and water, you                 may rinse your mouth with mouthwash if you wish.  Do not swallow any              toothpaste of mouthwash.     _X__ 3.  No Alcohol for 24 hours before or after surgery.   _X__ 4.  Do Not Smoke or use e-cigarettes For 24 Hours Prior to Your Surgery.                 Do not use any chewable tobacco products for at least 6 hours prior to                 surgery.  ____  5.  Bring all medications with you on the day of surgery if instructed.   __X__  6.  Notify your doctor if there is any change in your medical condition      (cold, fever, infections).     Do not wear jewelry, make-up, hairpins, clips or nail polish. Do not wear lotions, powders, or perfumes.  Do not shave 48 hours prior to surgery. Men may shave face and neck. Do not bring valuables to the hospital.    [facility] is not responsible for any belongings or  valuables.  Contacts, dentures/partials or body piercings may not be worn into surgery. Bring a case for your contacts, glasses or hearing aids, a denture cup will be supplied. Leave your suitcase in the car. After surgery it may be brought to your room. For patients admitted to the hospital, discharge time is determined by your treatment team.   Patients discharged the day of surgery will not be allowed to drive home.   Please read over the following fact sheets that you were given:   MRSA Information  __X__ Take these medicines the morning of surgery with A SIP OF WATER:  1. may take allergy meds if needed  2.   3.   4.  5.  6.  ____ Fleet Enema (as directed)   ____ Use CHG Soap/SAGE wipes as directed  ____ Use inhalers on the day of surgery  ____ Stop metformin/Janumet/Farxiga 2 days prior to surgery    ____ Take 1/2 of usual insulin dose the night before surgery. No insulin the morning          of surgery.   ____ Stop Blood Thinners Coumadin/Plavix/Xarelto/Pleta/Pradaxa/Eliquis/Effient/Aspirin  on   Or contact your Surgeon, Cardiologist or Medical Doctor regarding  ability to stop your blood thinners  __X__ Stop Anti-inflammatories 7 days before surgery such as Advil, Ibuprofen, Motrin,  BC or Goodies Powder, Naprosyn, Naproxen, Aleve, Aspirin    __X__ Stop all herbal supplements, fish oil or vitamin E until after surgery.    ____ Bring C-Pap to the hospital.

## 2020-06-16 ENCOUNTER — Other Ambulatory Visit
Admission: RE | Admit: 2020-06-16 | Discharge: 2020-06-16 | Disposition: A | Discharge status: Home / Self Care | Payer: BC Managed Care – PPO | Source: Ambulatory Visit | Attending: Urology | Admitting: Urology

## 2020-06-16 ENCOUNTER — Other Ambulatory Visit: Payer: Self-pay

## 2020-06-16 DIAGNOSIS — Z20822 Contact with and (suspected) exposure to covid-19: Secondary | ICD-10-CM | POA: Insufficient documentation

## 2020-06-16 DIAGNOSIS — Z01812 Encounter for preprocedural laboratory examination: Secondary | ICD-10-CM | POA: Insufficient documentation

## 2020-06-16 LAB — CULTURE, URINE COMPREHENSIVE

## 2020-06-16 LAB — SARS CORONAVIRUS 2 (TAT 6-24 HRS): SARS Coronavirus 2: NEGATIVE

## 2020-06-19 MED ORDER — CEFAZOLIN SODIUM-DEXTROSE 1-4 GM/50ML-% IV SOLN
1.0000 g | INTRAVENOUS | Status: AC
  Administered 2020-06-20: 2 g via INTRAVENOUS

## 2020-06-19 MED ORDER — SCOPOLAMINE 1 MG/3DAYS TD PT72: 1.0000 | MEDICATED_PATCH | TRANSDERMAL | Status: DC

## 2020-06-19 MED ORDER — LACTATED RINGERS IV SOLN: INTRAVENOUS | Status: DC

## 2020-06-19 MED ORDER — FAMOTIDINE 20 MG PO TABS: 20.0000 mg | ORAL_TABLET | Freq: Once | ORAL | Status: AC

## 2020-06-19 MED ORDER — CHLORHEXIDINE GLUCONATE 0.12 % MT SOLN: 15.0000 mL | Freq: Once | OROMUCOSAL | Status: AC

## 2020-06-19 MED ORDER — ORAL CARE MOUTH RINSE: 15.0000 mL | Freq: Once | OROMUCOSAL | Status: AC

## 2020-06-20 ENCOUNTER — Ambulatory Visit: Payer: BC Managed Care – PPO | Admitting: Urgent Care

## 2020-06-20 ENCOUNTER — Ambulatory Visit
Admission: RE | Admit: 2020-06-20 | Discharge: 2020-06-20 | Disposition: A | Discharge status: Home / Self Care | Payer: BC Managed Care – PPO | Attending: Urology | Admitting: Urology

## 2020-06-20 ENCOUNTER — Encounter: Payer: Self-pay | Admitting: Urology

## 2020-06-20 ENCOUNTER — Encounter
Admission: RE | Disposition: A | Discharge status: Home / Self Care | Payer: Self-pay | Source: Home / Self Care | Attending: Urology

## 2020-06-20 ENCOUNTER — Other Ambulatory Visit: Payer: Self-pay

## 2020-06-20 ENCOUNTER — Ambulatory Visit: Payer: BC Managed Care – PPO

## 2020-06-20 DIAGNOSIS — I313 Pericardial effusion (noninflammatory): Secondary | ICD-10-CM | POA: Insufficient documentation

## 2020-06-20 DIAGNOSIS — Z79899 Other long term (current) drug therapy: Secondary | ICD-10-CM | POA: Insufficient documentation

## 2020-06-20 DIAGNOSIS — Z791 Long term (current) use of non-steroidal anti-inflammatories (NSAID): Secondary | ICD-10-CM | POA: Diagnosis not present

## 2020-06-20 DIAGNOSIS — N201 Calculus of ureter: Secondary | ICD-10-CM | POA: Diagnosis not present

## 2020-06-20 DIAGNOSIS — N132 Hydronephrosis with renal and ureteral calculous obstruction: Secondary | ICD-10-CM | POA: Insufficient documentation

## 2020-06-20 HISTORY — PX: CYSTOSCOPY/URETEROSCOPY/HOLMIUM LASER/STENT PLACEMENT: SHX6546

## 2020-06-20 SURGERY — CYSTOSCOPY/URETEROSCOPY/HOLMIUM LASER/STENT PLACEMENT
Anesthesia: General | Laterality: Right

## 2020-06-20 MED ORDER — FAMOTIDINE 20 MG PO TABS
ORAL_TABLET | ORAL | Status: AC
  Administered 2020-06-20: 20 mg via ORAL
  Filled 2020-06-20: qty 1

## 2020-06-20 MED ORDER — MEPERIDINE HCL 50 MG/ML IJ SOLN: 6.2500 mg | INTRAMUSCULAR | Status: DC | PRN

## 2020-06-20 MED ORDER — PROPOFOL 500 MG/50ML IV EMUL
INTRAVENOUS | Status: DC | PRN
  Administered 2020-06-20: 140 ug/kg/min via INTRAVENOUS

## 2020-06-20 MED ORDER — ACETAMINOPHEN 10 MG/ML IV SOLN
INTRAVENOUS | Status: DC | PRN
  Administered 2020-06-20: 1000 mg via INTRAVENOUS

## 2020-06-20 MED ORDER — FENTANYL CITRATE (PF) 100 MCG/2ML IJ SOLN: 25.0000 ug | INTRAMUSCULAR | Status: DC | PRN

## 2020-06-20 MED ORDER — FENTANYL CITRATE (PF) 100 MCG/2ML IJ SOLN
INTRAMUSCULAR | Status: AC
  Filled 2020-06-20: qty 2

## 2020-06-20 MED ORDER — PHENYLEPHRINE HCL (PRESSORS) 10 MG/ML IV SOLN
INTRAVENOUS | Status: DC | PRN
  Administered 2020-06-20 (×2): 100 ug via INTRAVENOUS

## 2020-06-20 MED ORDER — FENTANYL CITRATE (PF) 100 MCG/2ML IJ SOLN
INTRAMUSCULAR | Status: DC | PRN
  Administered 2020-06-20 (×3): 25 ug via INTRAVENOUS

## 2020-06-20 MED ORDER — DEXMEDETOMIDINE (PRECEDEX) IN NS 20 MCG/5ML (4 MCG/ML) IV SYRINGE
PREFILLED_SYRINGE | INTRAVENOUS | Status: AC
  Filled 2020-06-20: qty 5

## 2020-06-20 MED ORDER — CEFAZOLIN SODIUM-DEXTROSE 2-4 GM/100ML-% IV SOLN
INTRAVENOUS | Status: AC
  Filled 2020-06-20: qty 100

## 2020-06-20 MED ORDER — ONDANSETRON HCL 4 MG/2ML IJ SOLN
INTRAMUSCULAR | Status: AC
  Filled 2020-06-20: qty 2

## 2020-06-20 MED ORDER — MIDAZOLAM HCL 2 MG/2ML IJ SOLN
INTRAMUSCULAR | Status: DC | PRN
  Administered 2020-06-20: 2 mg via INTRAVENOUS

## 2020-06-20 MED ORDER — LIDOCAINE HCL (CARDIAC) PF 100 MG/5ML IV SOSY
PREFILLED_SYRINGE | INTRAVENOUS | Status: DC | PRN
  Administered 2020-06-20: 80 mg via INTRAVENOUS

## 2020-06-20 MED ORDER — ACETAMINOPHEN 10 MG/ML IV SOLN
INTRAVENOUS | Status: AC
  Filled 2020-06-20: qty 100

## 2020-06-20 MED ORDER — ONDANSETRON HCL 4 MG/2ML IJ SOLN
INTRAMUSCULAR | Status: DC | PRN
  Administered 2020-06-20: 4 mg via INTRAVENOUS

## 2020-06-20 MED ORDER — PROPOFOL 10 MG/ML IV BOLUS
INTRAVENOUS | Status: AC
  Filled 2020-06-20: qty 20

## 2020-06-20 MED ORDER — DEXAMETHASONE SODIUM PHOSPHATE 10 MG/ML IJ SOLN
INTRAMUSCULAR | Status: DC | PRN
  Administered 2020-06-20: 10 mg via INTRAVENOUS

## 2020-06-20 MED ORDER — PROPOFOL 10 MG/ML IV BOLUS
INTRAVENOUS | Status: DC | PRN
  Administered 2020-06-20: 150 mg via INTRAVENOUS
  Administered 2020-06-20: 50 mg via INTRAVENOUS

## 2020-06-20 MED ORDER — DEXAMETHASONE SODIUM PHOSPHATE 10 MG/ML IJ SOLN
INTRAMUSCULAR | Status: AC
  Filled 2020-06-20: qty 1

## 2020-06-20 MED ORDER — MIDAZOLAM HCL 2 MG/2ML IJ SOLN
INTRAMUSCULAR | Status: AC
  Filled 2020-06-20: qty 2

## 2020-06-20 MED ORDER — PROMETHAZINE HCL 25 MG/ML IJ SOLN
6.2500 mg | INTRAMUSCULAR | Status: DC | PRN
Start: 2020-06-20 — End: 2020-06-20

## 2020-06-20 MED ORDER — SCOPOLAMINE 1 MG/3DAYS TD PT72
MEDICATED_PATCH | TRANSDERMAL | Status: AC
  Administered 2020-06-20: 1.5 mg via TRANSDERMAL
  Filled 2020-06-20: qty 1

## 2020-06-20 MED ORDER — CHLORHEXIDINE GLUCONATE 0.12 % MT SOLN
OROMUCOSAL | Status: AC
  Administered 2020-06-20: 15 mL via OROMUCOSAL
  Filled 2020-06-20: qty 15

## 2020-06-20 SURGICAL SUPPLY — 34 items
ADH LQ OCL WTPRF AMP STRL LF (MISCELLANEOUS) ×1
ADHESIVE MASTISOL STRL (MISCELLANEOUS) ×1 IMPLANT
BAG DRAIN CYSTO-URO LG1000N (MISCELLANEOUS) ×2 IMPLANT
BASKET ZERO TIP 1.9FR (BASKET) ×1 IMPLANT
BRUSH SCRUB EZ 1% IODOPHOR (MISCELLANEOUS) ×2 IMPLANT
BSKT STON RTRVL ZERO TP 1.9FR (BASKET) ×1
CATH URET FLEX-TIP 2 LUMEN 10F (CATHETERS) IMPLANT
CATH URETL 5X70 OPEN END (CATHETERS) ×2 IMPLANT
CNTNR SPEC 2.5X3XGRAD LEK (MISCELLANEOUS) ×1
CONT SPEC 4OZ STER OR WHT (MISCELLANEOUS) ×1
CONT SPEC 4OZ STRL OR WHT (MISCELLANEOUS) ×1
CONTAINER SPEC 2.5X3XGRAD LEK (MISCELLANEOUS) IMPLANT
DRAPE UTILITY 15X26 TOWEL STRL (DRAPES) ×2 IMPLANT
DRSG TEGADERM 2-3/8X2-3/4 SM (GAUZE/BANDAGES/DRESSINGS) ×1 IMPLANT
GLOVE SURG ENC MOIS LTX SZ6.5 (GLOVE) ×2 IMPLANT
GOWN STRL REUS W/ TWL LRG LVL3 (GOWN DISPOSABLE) ×2 IMPLANT
GOWN STRL REUS W/TWL LRG LVL3 (GOWN DISPOSABLE) ×4
GUIDEWIRE GREEN .038 145CM (MISCELLANEOUS) IMPLANT
GUIDEWIRE STR DUAL SENSOR (WIRE) ×2 IMPLANT
INFUSOR MANOMETER BAG 3000ML (MISCELLANEOUS) ×2 IMPLANT
KIT TURNOVER CYSTO (KITS) ×2 IMPLANT
MANIFOLD NEPTUNE II (INSTRUMENTS) ×2 IMPLANT
PACK CYSTO AR (MISCELLANEOUS) ×2 IMPLANT
SET CYSTO W/LG BORE CLAMP LF (SET/KITS/TRAYS/PACK) ×2 IMPLANT
SHEATH URETERAL 12FRX35CM (MISCELLANEOUS) IMPLANT
SOL .9 NS 3000ML IRR  AL (IV SOLUTION) ×2
SOL .9 NS 3000ML IRR AL (IV SOLUTION) ×1
SOL .9 NS 3000ML IRR UROMATIC (IV SOLUTION) ×1 IMPLANT
STENT URET 6FRX22 CONTOUR (STENTS) ×1 IMPLANT
STENT URET 6FRX24 CONTOUR (STENTS) IMPLANT
STENT URET 6FRX26 CONTOUR (STENTS) IMPLANT
SURGILUBE 2OZ TUBE FLIPTOP (MISCELLANEOUS) ×2 IMPLANT
TRACTIP FLEXIVA PULSE ID 200 (Laser) ×2 IMPLANT
WATER STERILE IRR 1000ML POUR (IV SOLUTION) ×2 IMPLANT

## 2020-06-20 NOTE — Interval H&P Note (Signed)
History and Physical Interval Note:  06/20/2020 12:16 PM  Carolyn Trevino  has presented today for surgery, with the diagnosis of right ureteral stone.  The various methods of treatment have been discussed with the patient and family. After consideration of risks, benefits and other options for treatment, the patient has consented to  Procedure(s): CYSTOSCOPY/URETEROSCOPY/HOLMIUM LASER/STENT Exchange (Right) as a surgical intervention.  The patient's history has been reviewed, patient examined, no change in status, stable for surgery.  I have reviewed the patient's chart and labs.  Questions were answered to the patient's satisfaction.    RRR CTAB  S/p completed course of abx  [name]

## 2020-06-20 NOTE — Anesthesia Postprocedure Evaluation (Signed)
Anesthesia Post Note  Patient: Carolyn Trevino  Procedure(s) Performed: CYSTOSCOPY/URETEROSCOPY/HOLMIUM LASER/STENT Exchange (Right )  Patient location during evaluation: PACU Anesthesia Type: General Level of consciousness: awake and alert Pain management: pain level controlled Vital Signs Assessment: post-procedure vital signs reviewed and stable Respiratory status: spontaneous breathing, nonlabored ventilation, respiratory function stable and patient connected to nasal cannula oxygen Cardiovascular status: blood pressure returned to baseline and stable Postop Assessment: no apparent nausea or vomiting Anesthetic complications: no   No complications documented.   Last Vitals:  Vitals:   06/20/20 1345 06/20/20 1400  BP: 109/75 124/79  Pulse: 79   Resp: 19   Temp:  36.4 C  SpO2: 96%     Last Pain:  Vitals:   06/20/20 1400  TempSrc:   PainSc: 0-No pain                 Corinda Gubler

## 2020-06-20 NOTE — Discharge Instructions (Addendum)
You have a ureteral stent in place.  This is a tube that extends from your kidney to your bladder.  This may cause urinary bleeding, burning with urination, and urinary frequency.  Please call our office or present to the ED if you develop fevers >101 or pain which is not able to be controlled with oral pain medications.  You may be given either Flomax and/ or ditropan to help with bladder spasms and stent pain in addition to pain medications.    Your stent is attached to a string.  This is taped to your left inner thigh.  On Thursday morning, you may untape and pulled gently until the entire stent is removed.  If you have any difficulty, please call our office for assistance.   [facility] [address], [address] [place], [place] [address] [phone]    AMBULATORY SURGERY  DISCHARGE INSTRUCTIONS   1) The drugs that you were given will stay in your system until tomorrow so for the next 24 hours you should not:  A) Drive an automobile B) Make any legal decisions C) Drink any alcoholic beverage   2) You may resume regular meals tomorrow.  Today it is better to start with liquids and gradually work up to solid foods.  You may eat anything you prefer, but it is better to start with liquids, then soup and crackers, and gradually work up to solid foods.   3) Please notify your doctor immediately if you have any unusual bleeding, trouble breathing, redness and pain at the surgery site, drainage, fever, or pain not relieved by medication.    4) Additional Instructions:        Please contact your physician with any problems or Same Day Surgery at [phone], Monday through Friday 6 am to 4 pm, or [place] at [facility] number at [phone].

## 2020-06-20 NOTE — Transfer of Care (Signed)
Immediate Anesthesia Transfer of Care Note  Patient: Carolyn Trevino  Procedure(s) Performed: CYSTOSCOPY/URETEROSCOPY/HOLMIUM LASER/STENT Exchange (Right )  Patient Location: PACU  Anesthesia Type:General  Level of Consciousness: drowsy  Airway & Oxygen Therapy: Patient Spontanous Breathing and Patient connected to face mask oxygen  Post-op Assessment: Report given to RN and Post -op Vital signs reviewed and stable  Post vital signs: Reviewed and stable  Last Vitals:  Vitals Value Taken Time  BP 96/59   Temp    Pulse 88   Resp 16   SpO2 99     Last Pain:  Vitals:   06/20/20 1203  TempSrc: Temporal  PainSc: 0-No pain      Patients Stated Pain Goal: 0 (06/20/20 1203)  Complications: No complications documented.

## 2020-06-20 NOTE — Op Note (Signed)
Date of procedure: 06/20/20  Preoperative diagnosis:  1. Right distal ureteral calculus 2. History of sepsis of urinary source  Postoperative diagnosis:  1. Same as above  Procedure: 1. Right ureteroscopy 2. Right laser lithotripsy 3. Basket extraction of stone fragment  4. Right retrograde pyelogram with radiologic interpretation less than 30 minutes 5. Right ureteral stent exchange  Surgeon: [name], MD  Anesthesia: General  Complications: None  Intraoperative findings: 6 mm right distal ureteral stone near the level of the iliacs.  Uncomplicated procedure, stent left on tether.  EBL: Minimal  Specimens: Stone fragment  Drains: 6 x 22 French double-J ureteral stent on right  Indication: Carolyn Trevino is a 55 y.o. patient with 6 mm right distal ureteral stone status post emergent ureteral stent placement the setting of sepsis.  She was adequately treated with antibiotics returns today for definitive stone procedure.  After reviewing the management options for treatment, she elected to proceed with the above surgical procedure(s). We have discussed the potential benefits and risks of the procedure, side effects of the proposed treatment, the likelihood of the patient achieving the goals of the procedure, and any potential problems that might occur during the procedure or recuperation. Informed consent has been obtained.  Description of procedure:  The patient was taken to the operating room and general anesthesia was induced.  The patient was placed in the dorsal lithotomy position, prepped and draped in the usual sterile fashion, and preoperative antibiotics were administered. A preoperative time-out was performed.   A 21 French cystoscope was advanced per urethra into the bladder.  Attention was turned to the right ureteral office for which a ureteral stent was seen emanating.  The distal stent coil was grasped and brought to level the urethral meatus.  This was then  cannulated using a sensor wire up to the level of the kidney.  The wire remained in place and the stent was removed.  On scout imaging, the stone could be seen near the level of the iliacs.  I was able to advance a 4.5 [place] semirigid ureteroscope up to the level of the stone and using a 242 m laser fiber with a settings of 0.8 J and 10 Hz, the stone was fragmented into 6 or 7 smaller pieces.  A 1.9 French tipless nitinol basket was then used to extract each and every stone fragment.  I then advanced the scope all the way up to the proximal ureter no additional stones were identified.  There were no ureteral injuries.  There was slight edema where the stone had been located but otherwise the ureter was unremarkable.  I injected Conray through the scope to create a retrograde pyelogram which showed some mild renal pelvic fullness but otherwise an unremarkable ureter and no contrast extravasation.  Finally, a 6 x 22 French double-J ureteral stent was advanced over the wire up to the level of the kidney.  The wire was withdrawn and a full coil was noted both within the renal pelvis as well as within the bladder.  The bladder was then drained.  The stent string was left attached to the distal coil the stent which was affixed to the patient's left inner thigh using muscle and Tegaderm.  She was then cleaned and dried, repositioned in supine position, reversed from anesthesia, and taken to the PACU in stable condition.  Plan: She will remove her own stent on Thursday.  We will follow up with her in 4 weeks with renal ultrasound prior.  Carolyn Trevino, M.D.

## 2020-06-20 NOTE — Anesthesia Preprocedure Evaluation (Addendum)
Anesthesia Evaluation  Patient identified by MRN, date of birth, ID band Patient awake    Reviewed: Allergy & Precautions, NPO status , Patient's Chart, lab work & pertinent test results  History of Anesthesia Complications (+) PONV and history of anesthetic complications  Airway Mallampati: II  TM Distance: >3 FB Neck ROM: Full    Dental no notable dental hx.    Pulmonary neg pulmonary ROS, neg sleep apnea, neg COPD,    breath sounds clear to auscultation- rhonchi (-) wheezing      Cardiovascular Exercise Tolerance: Good (-) hypertension(-) CAD, (-) Past MI, (-) Cardiac Stents and (-) CABG  Rhythm:Regular Rate:Normal - Systolic murmurs and - Diastolic murmurs 1. Left ventricular ejection fraction, by estimation, is 50 to 55%. The  left ventricle has low normal function. The left ventricle demonstrates  global hypokinesis. There is mild left ventricular hypertrophy. Left  ventricular diastolic parameters are  consistent with Grade I diastolic dysfunction (impaired relaxation).  2. Right ventricular systolic function is normal. The right ventricular  size is mildly enlarged. Mildly increased right ventricular wall  thickness.  3. Left atrial size was mildly dilated.  4. Right atrial size was mildly dilated.  5. The mitral valve is normal in structure. Mild mitral valve  regurgitation. No evidence of mitral stenosis.  6. The aortic valve is normal in structure. Aortic valve regurgitation is  not visualized. Mild aortic valve sclerosis is present, with no evidence  of aortic valve stenosis.  7. The inferior vena cava is normal in size with greater than 50%  respiratory variability, suggesting right atrial pressure of 3 mmHg.    Neuro/Psych neg Seizures negative neurological ROS  negative psych ROS   GI/Hepatic negative GI ROS, Neg liver ROS,   Endo/Other  negative endocrine ROSneg diabetes  Renal/GU Renal  InsufficiencyRenal disease     Musculoskeletal negative musculoskeletal ROS (+)   Abdominal (+) - obese,   Peds  Hematology negative hematology ROS (+)   Anesthesia Other Findings Past Medical History: No date: Complication of anesthesia     Comment:  n/v No date: History of kidney stones No date: Left breast mass No date: Pancreatic lesion No date: PONV (postoperative nausea and vomiting)   Reproductive/Obstetrics                            Anesthesia Physical Anesthesia Plan  ASA: II  Anesthesia Plan: General   Post-op Pain Management:    Induction: Intravenous  PONV Risk Score and Plan: 3 and Ondansetron, Dexamethasone, Propofol infusion, TIVA and Scopolamine patch - Pre-op  Airway Management Planned: Oral ETT  Additional Equipment:   Intra-op Plan:   Post-operative Plan: Extubation in OR  Informed Consent: I have reviewed the patients History and Physical, chart, labs and discussed the procedure including the risks, benefits and alternatives for the proposed anesthesia with the patient or authorized representative who has indicated his/her understanding and acceptance.     Dental advisory given  Plan Discussed with: CRNA and Anesthesiologist  Anesthesia Plan Comments:         Anesthesia Quick Evaluation

## 2020-06-20 NOTE — Anesthesia Procedure Notes (Addendum)
Procedure Name: LMA Insertion Date/Time: 06/20/2020 12:50 PM Performed by: Karoline Caldwell, CRNA Pre-anesthesia Checklist: Patient identified, Patient being monitored, Timeout performed, Emergency Drugs available and Suction available Patient Re-evaluated:Patient Re-evaluated prior to induction Oxygen Delivery Method: Circle system utilized Preoxygenation: Pre-oxygenation with 100% oxygen Induction Type: IV induction Ventilation: Mask ventilation without difficulty LMA: LMA inserted LMA Size: 3.0 Tube type: Oral Number of attempts: 2 Placement Confirmation: positive ETCO2 and breath sounds checked- equal and bilateral Tube secured with: Tape Dental Injury: Teeth and Oropharynx as per pre-operative assessment and Injury to lip  Comments: Unable to ventilate through 3.5 LMA after attempts x2.  Adequate Vt delivered with size 3 LMA. Small nick to Left lower lip with initial placement.  Lubricant applied.

## 2020-06-21 ENCOUNTER — Encounter: Payer: Self-pay | Admitting: Urology

## 2020-06-21 ENCOUNTER — Telehealth: Payer: Self-pay

## 2020-06-21 NOTE — Telephone Encounter (Signed)
Per Dr Apolinar Junes, patient scheduled for 4 week follow up with RUS prior. LMOM for patient to call office back or check her mychart acct.

## 2020-06-23 LAB — CALCULI, WITH PHOTOGRAPH (CLINICAL LAB)
Calcium Oxalate Dihydrate: 20 %
Calcium Oxalate Monohydrate: 80 %
Weight Calculi: 10 mg

## 2020-07-02 ENCOUNTER — Other Ambulatory Visit: Payer: Self-pay | Admitting: Urology

## 2020-07-02 DIAGNOSIS — N201 Calculus of ureter: Secondary | ICD-10-CM

## 2020-07-06 ENCOUNTER — Other Ambulatory Visit: Payer: Self-pay | Admitting: Physician Assistant

## 2020-07-06 DIAGNOSIS — N201 Calculus of ureter: Secondary | ICD-10-CM

## 2020-07-13 ENCOUNTER — Telehealth: Payer: Self-pay

## 2020-07-13 DIAGNOSIS — R109 Unspecified abdominal pain: Secondary | ICD-10-CM

## 2020-07-13 DIAGNOSIS — N201 Calculus of ureter: Secondary | ICD-10-CM

## 2020-07-13 NOTE — Telephone Encounter (Signed)
Patient called stating that she did well post URS and removal of her own stent. She states last week she started to have right side intermittent flank pain 2-3/10 on Saturday. She is not sure if she should be concerned or not. She denies urinary symptoms. Patient was to have a post op apt next week with RUS but due to radiology scheduling back log she is not able to come in until May 12th for RUS and follow up after. Patient wants to make sure it is ok to wait this long for follow up with her now having this discomfort?  Patient did mention that she did some heavy lifting last week on Thursday and wonders if this could possibly have irritated her and be causing the flank pain.

## 2020-07-13 NOTE — Telephone Encounter (Signed)
Carolyn Trevino, this patient is now having flank pain and needs to have a renal ultrasound sooner.  Can you call radiology and see if they can move it up?  This is no longer routine.  [name], MD

## 2020-07-13 NOTE — Telephone Encounter (Signed)
Patient called stating that she did well post URS  Right side intermittent flank pain 2-3/10

## 2020-07-14 ENCOUNTER — Ambulatory Visit
Admission: RE | Admit: 2020-07-14 | Discharge: 2020-07-14 | Disposition: A | Discharge status: Home / Self Care | Payer: BC Managed Care – PPO | Source: Ambulatory Visit | Attending: Urology | Admitting: Urology

## 2020-07-14 ENCOUNTER — Other Ambulatory Visit: Payer: Self-pay

## 2020-07-14 DIAGNOSIS — N201 Calculus of ureter: Secondary | ICD-10-CM | POA: Diagnosis present

## 2020-07-14 DIAGNOSIS — R109 Unspecified abdominal pain: Secondary | ICD-10-CM

## 2020-07-14 IMAGING — US US RENAL
1 series · 14 of 25 positions shown · non-contrast
Comparison: CT abdomen and pelvis [DATE].  No prior ultrasound.

CLINICAL DATA: Recurrent RIGHT flank pain and RIGHT groin pain.
Patient had a RIGHT ureteral calculus for wall which she underwent
lithotripsy in [DATE] and a subsequent RIGHT ureteral stent
placement. Her symptoms improved thereafter, but have now recurred.

EXAM:
RENAL / URINARY TRACT ULTRASOUND COMPLETE

[Series 1: us renal · 0.22mm/px · 14 of 44 slices shown]
[im 1/44]
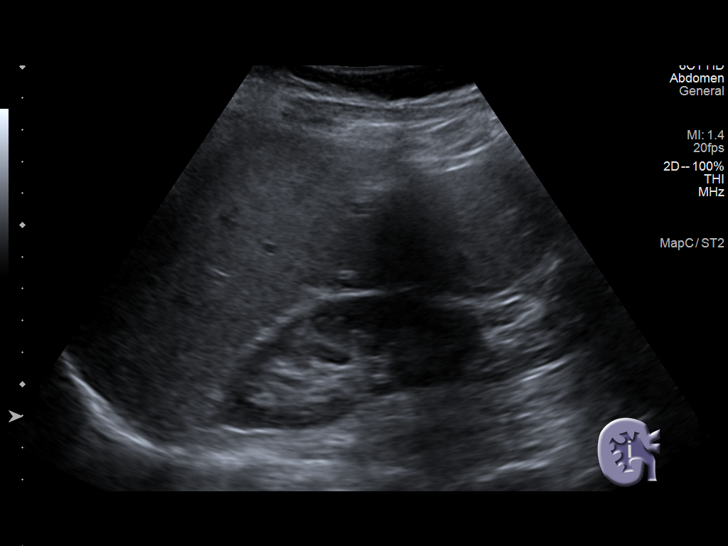
[im 4/44]
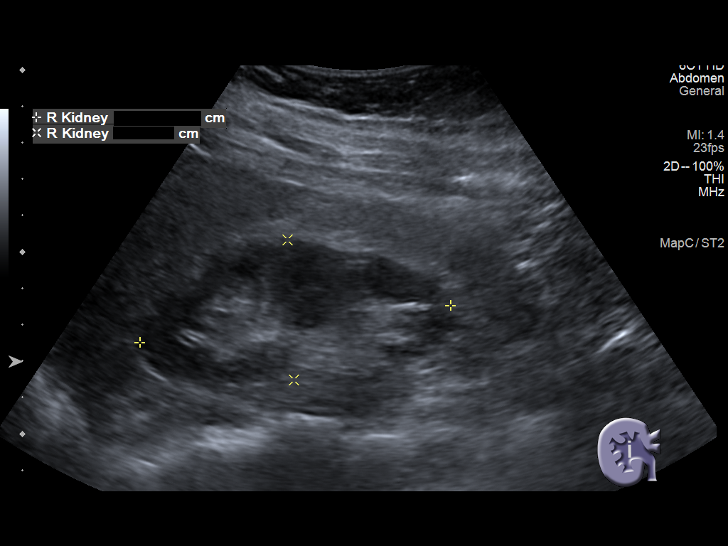
[im 8/44]
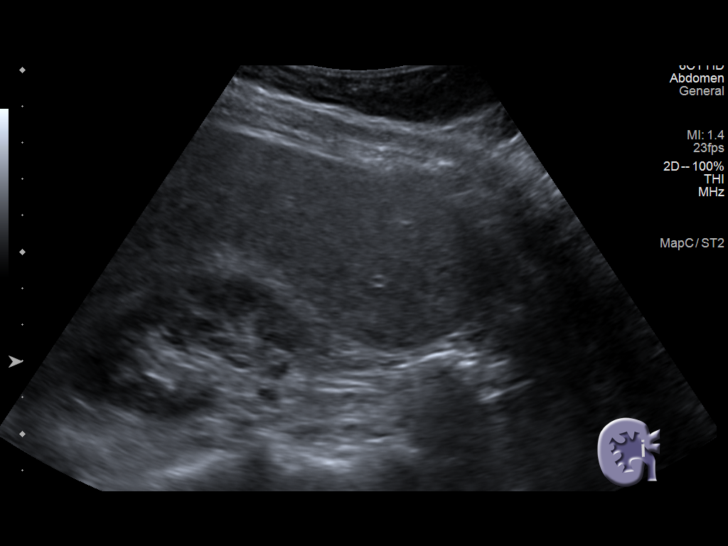
[im 11/44]
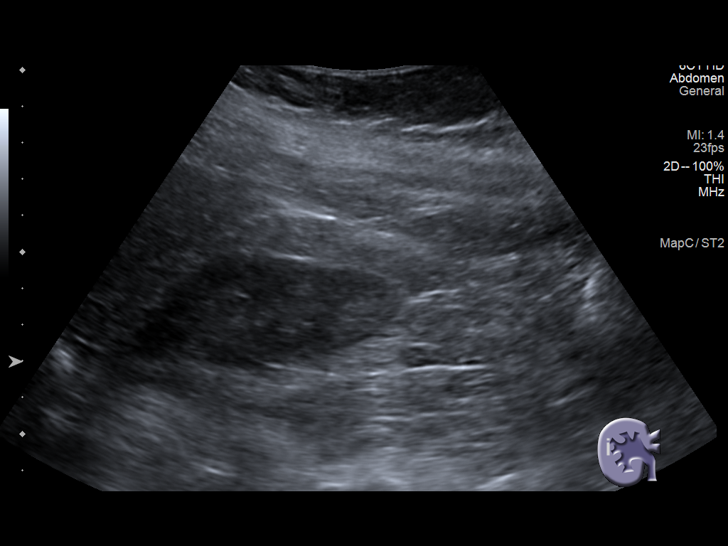
[im 15/44]
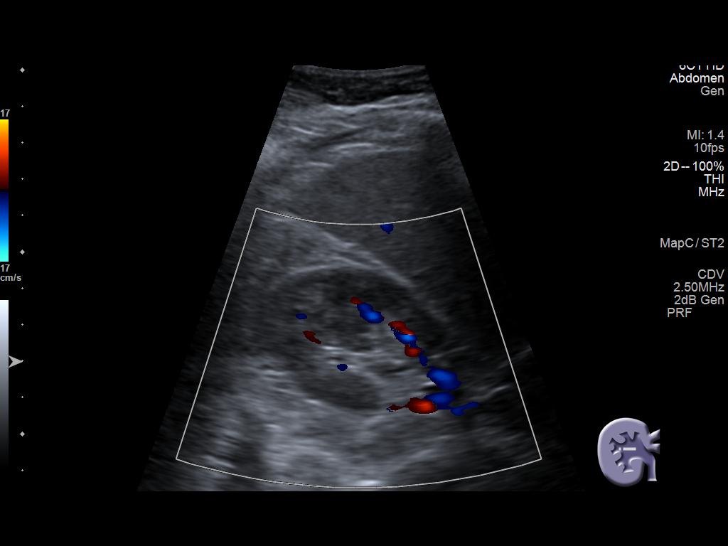
[im 17/44]
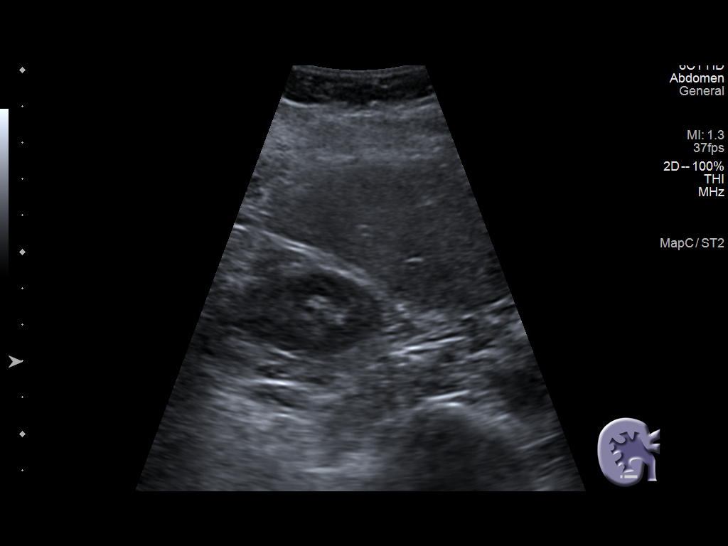
[im 20/44]
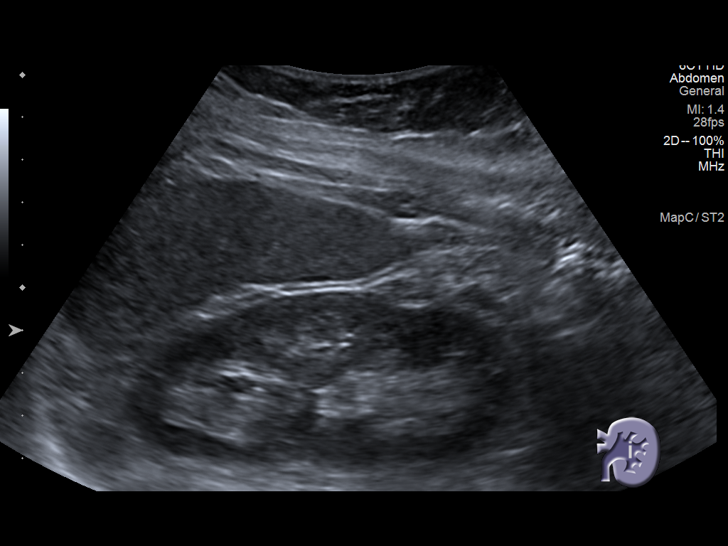
[im 24/44]
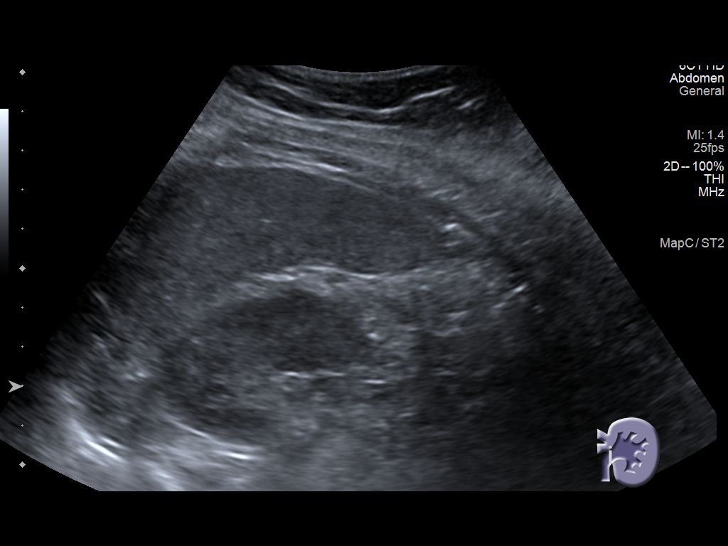
[im 27/44]
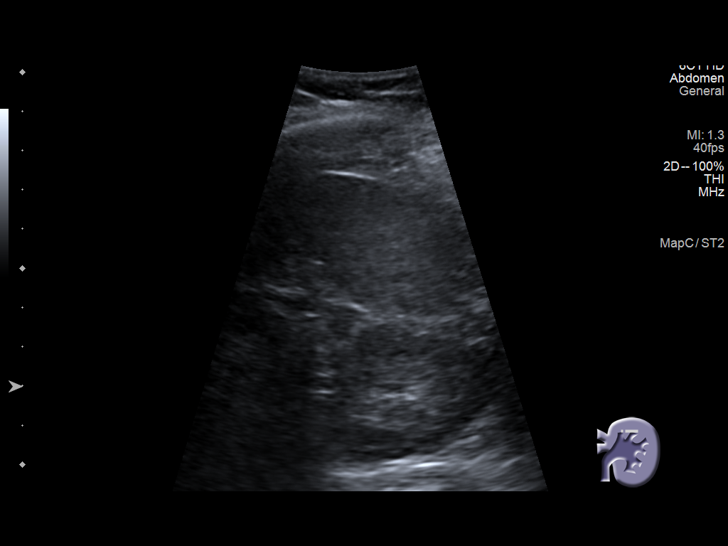
[im 29/44]
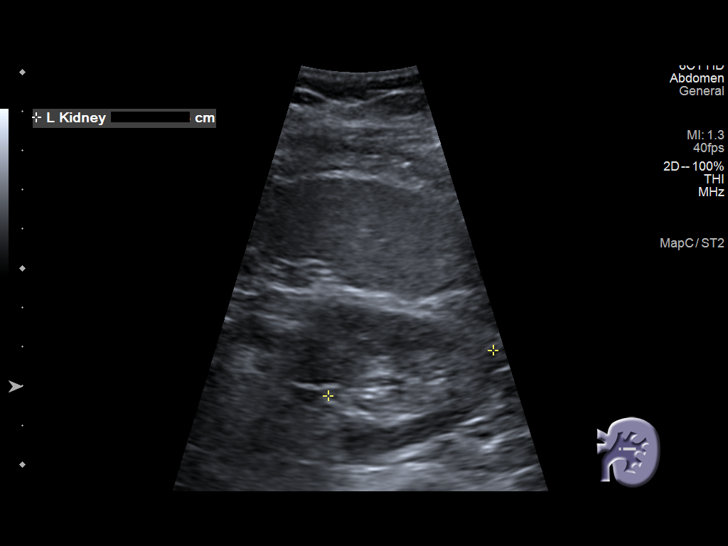
[im 33/44]
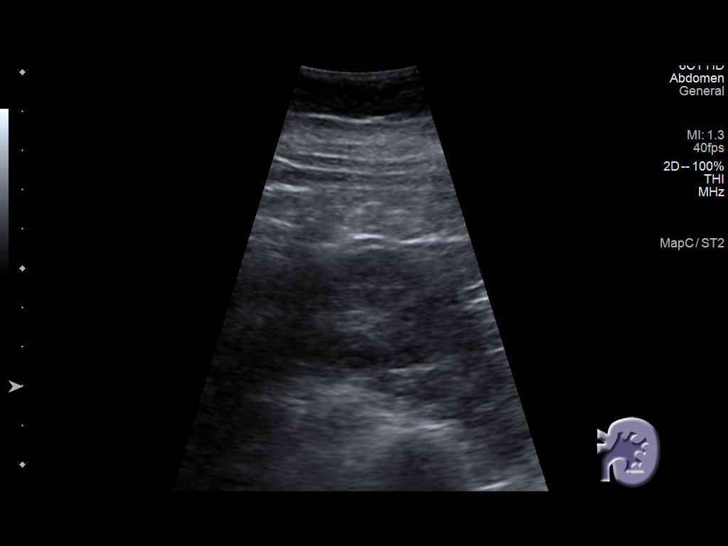
[im 36/44]
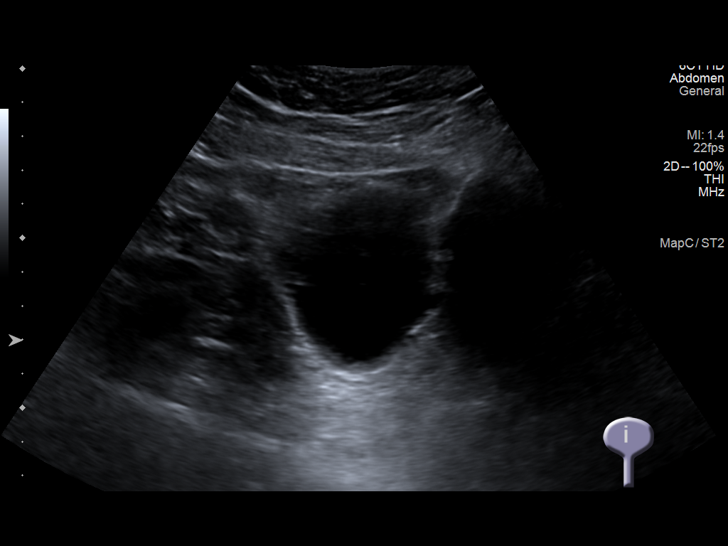
[im 40/44]
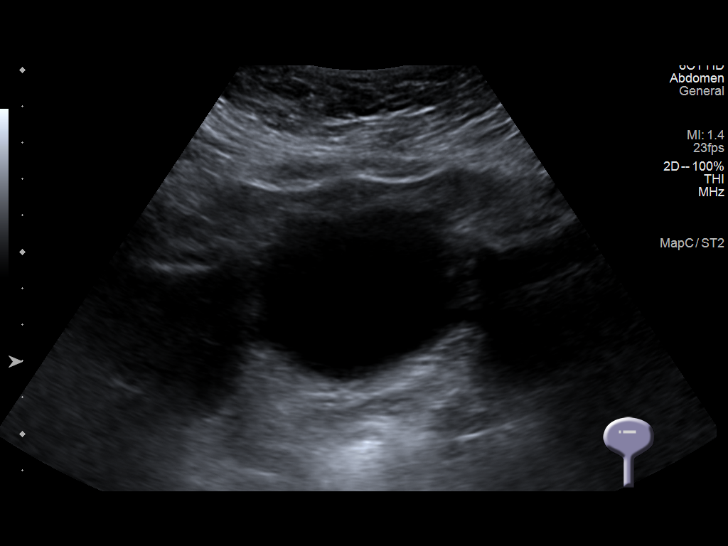
[im 44/44]
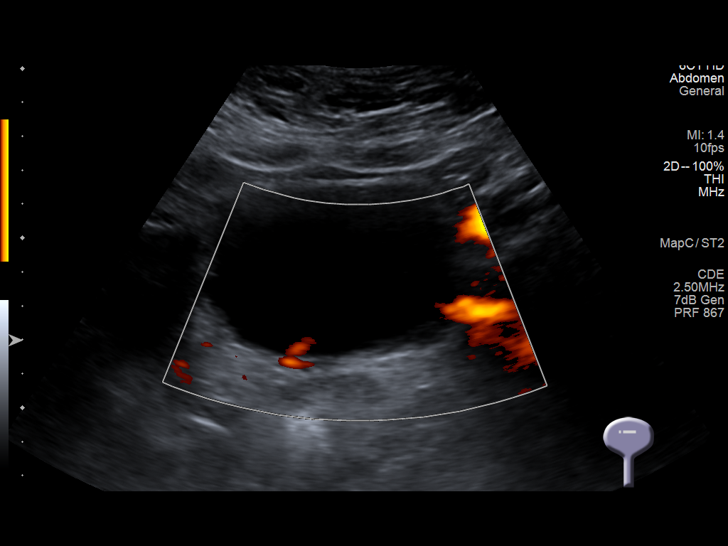

[14 of 25 positions shown; findings below may reference images not displayed]

FINDINGS: Right Kidney:

Renal measurements: Approximately 8.8 x 3.5 x 4.6 cm = volume: 74
mL. No hydronephrosis. Well-preserved cortex. No shadowing calculi.
Normal parenchymal echotexture. No focal parenchymal abnormality.

Left Kidney:

Renal measurements: Approximately 9.1 x 4.2 x 4.4 cm = volume: 87
mL. No hydronephrosis. Well-preserved cortex. No shadowing calculi.
Normal parenchymal echotexture. No focal parenchymal abnormality.

Bladder:

Normal for degree of bladder distention. BILATERAL ureteral jets
visible at power Doppler evaluation.

Other:

None.
IMPRESSION: Normal examination. Specifically, no evidence of recurrent RIGHT
urinary tract obstruction.

## 2020-07-18 ENCOUNTER — Telehealth: Payer: Self-pay | Admitting: *Deleted

## 2020-07-18 NOTE — Telephone Encounter (Addendum)
Patient advised, aware to keep follow up-if any pain to contact the office. Voiced understanding.   ----- Message from [name], MD sent at 07/14/2020  6:00 PM EDT ----- Renal ultrasound is perfect!  No obstruction or blockage, no stones.   [name], MD

## 2020-07-19 ENCOUNTER — Ambulatory Visit: Payer: BC Managed Care – PPO | Admitting: Urology

## 2020-07-25 ENCOUNTER — Telehealth: Payer: Self-pay

## 2020-07-25 NOTE — Telephone Encounter (Signed)
Progress notes on file sent from [facility], phone no. [phone]. A copy of referral have been sent to scheduling

## 2020-07-28 ENCOUNTER — Telehealth (HOSPITAL_COMMUNITY): Payer: Self-pay | Admitting: Vascular Surgery

## 2020-07-28 NOTE — Telephone Encounter (Signed)
Faxed referring office Dr. Nemiah Commander , pt is not a candidate to be seen in the heart failure clinic , pt will need to be referred to [facility]

## 2020-08-11 ENCOUNTER — Ambulatory Visit: Payer: BC Managed Care – PPO

## 2020-08-16 ENCOUNTER — Ambulatory Visit (INDEPENDENT_AMBULATORY_CARE_PROVIDER_SITE_OTHER): Payer: BC Managed Care – PPO | Admitting: Urology

## 2020-08-16 ENCOUNTER — Other Ambulatory Visit: Payer: Self-pay

## 2020-08-16 VITALS — BP 135/87 | HR 88 | Wt 150.0 lb

## 2020-08-16 DIAGNOSIS — N1330 Unspecified hydronephrosis: Secondary | ICD-10-CM

## 2020-08-16 DIAGNOSIS — Z87442 Personal history of urinary calculi: Secondary | ICD-10-CM

## 2020-08-16 NOTE — Progress Notes (Signed)
08/16/2020 9:07 AM   Carolyn Trevino [facility] 1965-07-01 [id number]  Referring provider: Enid Baas, MD [address] [place],  [place] [address]  Chief Complaint  Patient presents with  . Right ureteral stone    HPI: 55 year old female who presents today for follow-up of a right distal ureteral calculus.  She initially presented with signs symptoms of sepsis of urinary source with an obstructing right distal stone.  She underwent ureteral stenting and infection was treated.  She return for staged ureteroscopic procedure on 06/20/2020 for definitive management of her stone.  This was uncomplicated.  She removed her own stent.  She was having some intermittent discomfort concern for ongoing obstruction.  We expedited her renal ultrasound which showed no hydroureteronephrosis or stone fragments.  Stone analysis completed, 80% calcium oxalate monohydrate, 20% calcium oxalate.  No personal history of kidney stones prior to this.   She denies any ongoing flank pain.  She does try to drink lots of water but is not drinking anywhere near 100 ounces.  She is yet to follow-up with cardiology but is working to do this.  She is no longer having flank pain.   PMH: Past Medical History:  Diagnosis Date  . Complication of anesthesia    n/v  . History of kidney stones   . Left breast mass   . Pancreatic lesion   . PONV (postoperative nausea and vomiting)     Surgical History: Past Surgical History:  Procedure Laterality Date  . ABLATION ON ENDOMETRIOSIS    . ANAL FISSURE REPAIR    . CYSTOSCOPY WITH STENT PLACEMENT Right 05/30/2020   Procedure: CYSTOSCOPY WITH STENT PLACEMENT;  Surgeon: [name], MD;  Location: [facility] ORS;  Service: Urology;  Laterality: Right;  . CYSTOSCOPY/URETEROSCOPY/HOLMIUM LASER/STENT PLACEMENT Right 06/20/2020   Procedure: CYSTOSCOPY/URETEROSCOPY/HOLMIUM LASER/STENT Exchange;  Surgeon: [name], MD;  Location: [facility] ORS;  Service: Urology;   Laterality: Right;  . EXTRACORPOREAL SHOCK WAVE LITHOTRIPSY Right 05/12/2020   Procedure: EXTRACORPOREAL SHOCK WAVE LITHOTRIPSY (ESWL);  Surgeon: Sondra Come, MD;  Location: [facility] ORS;  Service: Urology;  Laterality: Right;  . EYE SURGERY    . WISDOM TOOTH EXTRACTION      Home Medications:  Allergies as of 08/16/2020      Reactions   Hydrocodone Nausea Only   Wasp Venom Swelling   Wasp Venom Protein    Sulfamethoxazole-trimethoprim Rash      Medication List       Accurate as of Aug 16, 2020  9:07 AM. If you have any questions, ask your nurse or doctor.        STOP taking these medications   butalbital-acetaminophen-caffeine 50-325-40 MG tablet Commonly known as: FIORICET Stopped by: [name], MD   pseudoephedrine 30 MG tablet Commonly known as: SUDAFED Stopped by: [name], MD   tamsulosin 0.4 MG Caps capsule Commonly known as: FLOMAX Stopped by: [name], MD     TAKE these medications   fexofenadine 180 MG tablet Commonly known as: ALLEGRA Take 180 mg by mouth daily as needed for allergies or rhinitis.   MULTIVITAMIN GUMMIES ADULT PO Take 2 tablets by mouth daily.   Vitamin C Gummies 125 MG Chew Generic drug: Ascorbic Acid Chew 250 mg by mouth daily.       Allergies:  Allergies  Allergen Reactions  . Hydrocodone Nausea Only  . Wasp Venom Swelling  . Wasp Venom Protein   . Sulfamethoxazole-Trimethoprim Rash    Family History: Family History  Problem Relation Age of  Onset  . Breast cancer Mother 56  . Prostate cancer Father     Social History:  reports that she has never smoked. She has never used smokeless tobacco. She reports that she does not drink alcohol and does not use drugs.   Physical Exam: BP 135/87   Pulse 88   Wt 150 lb (68 kg)   BMI 29.29 kg/m   Constitutional:  Alert and oriented, No acute distress. HEENT: [place] AT, moist mucus membranes.  Trachea midline, no masses. Cardiovascular: No clubbing, cyanosis, or  edema. Respiratory: Normal respiratory effort, no increased work of breathing. Skin: No rashes, bruises or suspicious lesions. Neurologic: Grossly intact, no focal deficits, moving all 4 extremities. Psychiatric: Normal mood and affect.   Pertinent Imaging: US RENAL  Narrative CLINICAL DATA:  Recurrent RIGHT flank pain and RIGHT groin pain. Patient had a RIGHT ureteral calculus for wall which she underwent lithotripsy in February, 2022 and a subsequent RIGHT ureteral stent placement. Her symptoms improved thereafter, but have now recurred.  EXAM: RENAL / URINARY TRACT ULTRASOUND COMPLETE  COMPARISON:  CT abdomen and pelvis 05/30/2020.  No prior ultrasound.  FINDINGS: Right Kidney:  Renal measurements: Approximately 8.8 x 3.5 x 4.6 cm = volume: 74 mL. No hydronephrosis. Well-preserved cortex. No shadowing calculi. Normal parenchymal echotexture. No focal parenchymal abnormality.  Left Kidney:  Renal measurements: Approximately 9.1 x 4.2 x 4.4 cm = volume: 87 mL. No hydronephrosis. Well-preserved cortex. No shadowing calculi. Normal parenchymal echotexture. No focal parenchymal abnormality.  Bladder:  Normal for degree of bladder distention. BILATERAL ureteral jets visible at power Doppler evaluation.  Other:  None.  IMPRESSION: Normal examination. Specifically, no evidence of recurrent RIGHT urinary tract obstruction.   Electronically Signed By: Hulan Saas M.D. On: 07/14/2020 17:08  Ultrasound imaging was personally reviewed.  Complete resolution of right-sided hydroureteronephrosis without any evidence of ongoing obstruction.  Assessment & Plan:    1. History of kidney stones Stone analysis personally reviewed along with patient  We discussed general stone prevention techniques including drinking plenty water with goal of producing 2.5 L urine daily, increased citric acid intake, avoidance of high oxalate containing foods, and decreased salt intake.   Information about dietary recommendations given today.  We will have her follow-up as needed given that this is her first and only stone event  2. Hydronephrosis, right Resolved, renal ultrasound reviewed   F/u prn   [name], MD  [facility] [address], [address] [place], [place] [address] [phone]

## 2020-08-18 LAB — COLOGUARD: COLOGUARD: NEGATIVE

## 2020-08-18 LAB — EXTERNAL GENERIC LAB PROCEDURE: COLOGUARD: NEGATIVE

## 2020-09-25 DIAGNOSIS — I10 Essential (primary) hypertension: Secondary | ICD-10-CM | POA: Insufficient documentation

## 2020-09-25 NOTE — Progress Notes (Signed)
Cardiology Office Note  Date:  09/26/2020   ID:  Carolyn Trevino [place], DOB July 30, 1965, MRN [id number]  PCP:  Enid Baas, MD   Chief Complaint  Patient presents with   New Patient (Initial Visit)    Ref by Dr. Nemiah Commander for abnormal Echo and elevated blood pressure. Patient c/o shortness of breath with exertion. Medications reviewed by the patient verbally.     HPI:  Ms. Carolyn Trevino is a 55 year old woman with past medical history of Kidney stones Hyperlipidemia Referred by dr. Nemiah Commander for consultation of her abnormal echo and HTN  Recent events reviewed, diagnosed with kidney stone February 2022 Underwent lithotripsy, unsuccessful Then stent placement Treated for infection Repeat stone extraction, then removal of stent, additional stent placed  End of February 2022 CT scan renal stone study showing small pericardial effusion, Images pulled up and reviewed in detail with her on today's visit  Echo 05/2020 reviewed No significant pericardial effusion noted  1. Left ventricular ejection fraction, by estimation, is 50 to 55%. The  left ventricle has low normal function. The left ventricle demonstrates  global hypokinesis. There is mild left ventricular hypertrophy. Left  ventricular diastolic parameters are  consistent with Grade I diastolic dysfunction (impaired relaxation).   2. Right ventricular systolic function is normal. The right ventricular  size is mildly enlarged. Mildly increased right ventricular wall  thickness.   3. Left atrial size was mildly dilated.   4. Right atrial size was mildly dilated.   5. The mitral valve is normal in structure. Mild mitral valve  regurgitation. No evidence of mitral stenosis.   6. The aortic valve is normal in structure. Aortic valve regurgitation is  not visualized. Mild aortic valve sclerosis is present, with no evidence  of aortic valve stenosis.   7. The inferior vena cava is normal in size with greater than 50%   respiratory variability, suggesting right atrial pressure of 3 mmHg.   Lab work reviewed Total chol 240, LDL 140 A1C 5.4  Active at baseline, denies shortness of breath or symptoms concerning for angina  EKG personally reviewed by myself on todays visit Normal sinus rhythm rate 85 bpm no significant ST or T wave changes   PMH:   has a past medical history of Complication of anesthesia, History of kidney stones, Left breast mass, Pancreatic lesion, PONV (postoperative nausea and vomiting), and Seasonal allergies.  PSH:    Past Surgical History:  Procedure Laterality Date   ABLATION ON ENDOMETRIOSIS     ANAL FISSURE REPAIR     CYSTOSCOPY WITH STENT PLACEMENT Right 05/30/2020   Procedure: CYSTOSCOPY WITH STENT PLACEMENT;  Surgeon: [name], MD;  Location: [facility];  Service: Urology;  Laterality: Right;   CYSTOSCOPY/URETEROSCOPY/HOLMIUM LASER/STENT PLACEMENT Right 06/20/2020   Procedure: CYSTOSCOPY/URETEROSCOPY/HOLMIUM LASER/STENT Exchange;  Surgeon: [name], MD;  Location: [facility];  Service: Urology;  Laterality: Right;   EXTRACORPOREAL SHOCK WAVE LITHOTRIPSY Right 05/12/2020   Procedure: EXTRACORPOREAL SHOCK WAVE LITHOTRIPSY (ESWL);  Surgeon: Sondra Come, MD;  Location: [facility];  Service: Urology;  Laterality: Right;   EYE SURGERY     WISDOM TOOTH EXTRACTION      Current Outpatient Medications  Medication Sig Dispense Refill   Ascorbic Acid (VITAMIN C GUMMIES) 125 MG CHEW Chew 250 mg by mouth daily.     loratadine (ALAVERT) 10 MG dissolvable tablet Take 10 mg by mouth daily.     Multiple Vitamins-Minerals (MULTIVITAMIN GUMMIES ADULT PO) Take 2 tablets by mouth daily.     Probiotic  Product (PROBIOTIC DAILY) CAPS daily.     Pyridoxine HCl (B-6) 100 MG TABS Take 100 mg by mouth in the morning and at bedtime.     No current facility-administered medications for this visit.     Allergies:   Hydrocodone, Wasp venom, Wasp venom protein, and  Sulfamethoxazole-trimethoprim   Social History:  The patient  reports that she has never smoked. She has never used smokeless tobacco. She reports that she does not drink alcohol and does not use drugs.   Family History:   family history includes Breast cancer (age of onset: 65) in her mother; Heart attack in her paternal grandfather; Heart disease in her maternal aunt and mother; Hypertension in her father; Prostate cancer in her father.    Review of Systems: Review of Systems  Constitutional: Negative.   HENT: Negative.    Respiratory: Negative.    Cardiovascular: Negative.   Gastrointestinal: Negative.   Musculoskeletal: Negative.   Neurological: Negative.   Psychiatric/Behavioral: Negative.    All other systems reviewed and are negative.   PHYSICAL EXAM: VS:  BP (!) 140/98 (BP Location: Right Arm, Patient Position: Sitting, Cuff Size: Normal)   Pulse 85   Ht 5' (1.524 m)   Wt 156 lb 6 oz (70.9 kg)   SpO2 98%   BMI 30.54 kg/m  , BMI Body mass index is 30.54 kg/m. GEN: Well nourished, well developed, in no acute distress HEENT: normal Neck: no JVD, carotid bruits, or masses Cardiac: RRR; no murmurs, rubs, or gallops,no edema  Respiratory:  clear to auscultation bilaterally, normal work of breathing GI: soft, nontender, nondistended, + BS MS: no deformity or atrophy Skin: warm and dry, no rash Neuro:  Strength and sensation are intact Psych: euthymic mood, full affect   Recent Labs: 05/30/2020: ALT 20; B Natriuretic Peptide 27.6 05/31/2020: BUN 16; Creatinine, Ser 1.12; Hemoglobin 11.4; Platelets 288; Potassium 4.4; Sodium 138    Lipid Panel No results found for: CHOL, HDL, LDLCALC, TRIG    Wt Readings from Last 3 Encounters:  09/26/20 156 lb 6 oz (70.9 kg)  08/16/20 150 lb (68 kg)  06/20/20 150 lb (68 kg)       ASSESSMENT AND PLAN:  Problem List Items Addressed This Visit       Cardiology Problems   Benign essential HTN   Relevant Orders   EKG 12-Lead    Pericardial effusion - Primary   Relevant Orders   EKG 12-Lead   Other Visit Diagnoses     Mixed hyperlipidemia          Pericardial effusion In the setting of hydronephrosis, kidney stone in February 2022 Follow-up echocardiogram, pericardial effusion resolved No further work-up needed at this time  Hyperlipidemia Discussed various treatment options Review of CT scan showing no coronary calcification, no aortic atherosclerosis extending through the iliac vessels She prefers no medications at this time, Recommend she could do periodic CT coronary calcium scoring every 5 or 10 years if she desires to help guide therapy  Essential hypertension Before starting medication recommend she check blood pressure at home with her new blood pressure cuff Suggested she call our office or primary care with blood pressure numbers   Total encounter time more than 60 minutes  Greater than 50% was spent in counseling and coordination of care with the patient  Patient was seen in consultation for Dr. Nemiah Commander and will be referred back to her office for ongoing care of the issues detailed above   Signed, Dossie Arbour, M.D.,  Ph.D. [facility] [place], [place] [phone]

## 2020-09-26 ENCOUNTER — Encounter: Payer: Self-pay | Admitting: Cardiovascular Disease

## 2020-09-26 ENCOUNTER — Ambulatory Visit (INDEPENDENT_AMBULATORY_CARE_PROVIDER_SITE_OTHER): Payer: BC Managed Care – PPO | Admitting: Cardiovascular Disease

## 2020-09-26 ENCOUNTER — Other Ambulatory Visit: Payer: Self-pay

## 2020-09-26 VITALS — BP 140/98 | HR 85 | Ht 60.0 in | Wt 156.4 lb

## 2020-09-26 DIAGNOSIS — I3139 Other pericardial effusion (noninflammatory): Secondary | ICD-10-CM

## 2020-09-26 DIAGNOSIS — I313 Pericardial effusion (noninflammatory): Secondary | ICD-10-CM | POA: Diagnosis not present

## 2020-09-26 DIAGNOSIS — E782 Mixed hyperlipidemia: Secondary | ICD-10-CM | POA: Diagnosis not present

## 2020-09-26 DIAGNOSIS — I10 Essential (primary) hypertension: Secondary | ICD-10-CM | POA: Diagnosis not present

## 2020-09-26 NOTE — Patient Instructions (Addendum)
Medication Instructions:  No changes, please continue your current medications   If you need a refill on your cardiac medications before your next appointment, please call your pharmacy.   Lab work: No new labs needed  Testing/Procedures: No new testing needed  Follow-Up: At [facility], you and your health needs are our priority.  As part of our continuing mission to provide you with exceptional heart care, we have created designated Provider Care Teams.  These Care Teams include your primary Cardiologist (physician) and Advanced Practice Providers (APPs -  Physician Assistants and Nurse Practitioners) who all work together to provide you with the care you need, when you need it.  You will need a follow up appointment as needed  Providers on your designated Care Team:   Christopher Berge, NP Ryan Dunn, PA-C Jacquelyn Visser, PA-C Cadence Furth, PA-C  COVID-19 Vaccine Information can be found at: [email or web address] For questions related to vaccine distribution or appointments, please email [email or web address] or call [phone].    

## 2020-11-21 ENCOUNTER — Other Ambulatory Visit: Payer: Self-pay

## 2020-11-21 ENCOUNTER — Ambulatory Visit
Admission: RE | Admit: 2020-11-21 | Discharge: 2020-11-21 | Disposition: A | Discharge status: Home / Self Care | Payer: BC Managed Care – PPO | Source: Ambulatory Visit | Attending: Obstetrics and Gynecology | Admitting: Obstetrics and Gynecology

## 2020-11-21 DIAGNOSIS — N6002 Solitary cyst of left breast: Secondary | ICD-10-CM | POA: Diagnosis not present

## 2020-11-21 IMAGING — US US BREAST*L* LIMITED INC AXILLA
1 series · 6 of 6 positions shown · non-contrast
Comparison: Previous exam(s).

CLINICAL DATA: Short-term interval follow-up of a probable benign
mass in the left breast.

EXAM:
DIGITAL DIAGNOSTIC UNILATERAL LEFT MAMMOGRAM WITH TOMOSYNTHESIS AND
CAD; ULTRASOUND LEFT BREAST LIMITED
TECHNIQUE: Left digital diagnostic mammography and breast tomosynthesis was
performed. The images were evaluated with computer-aided detection.;
Targeted ultrasound examination of the left breast was performed.

[Series 1: us breast*left* limited inc axilla · 0.06mm/px · 6 of 6 slices shown]
[im 1/6]
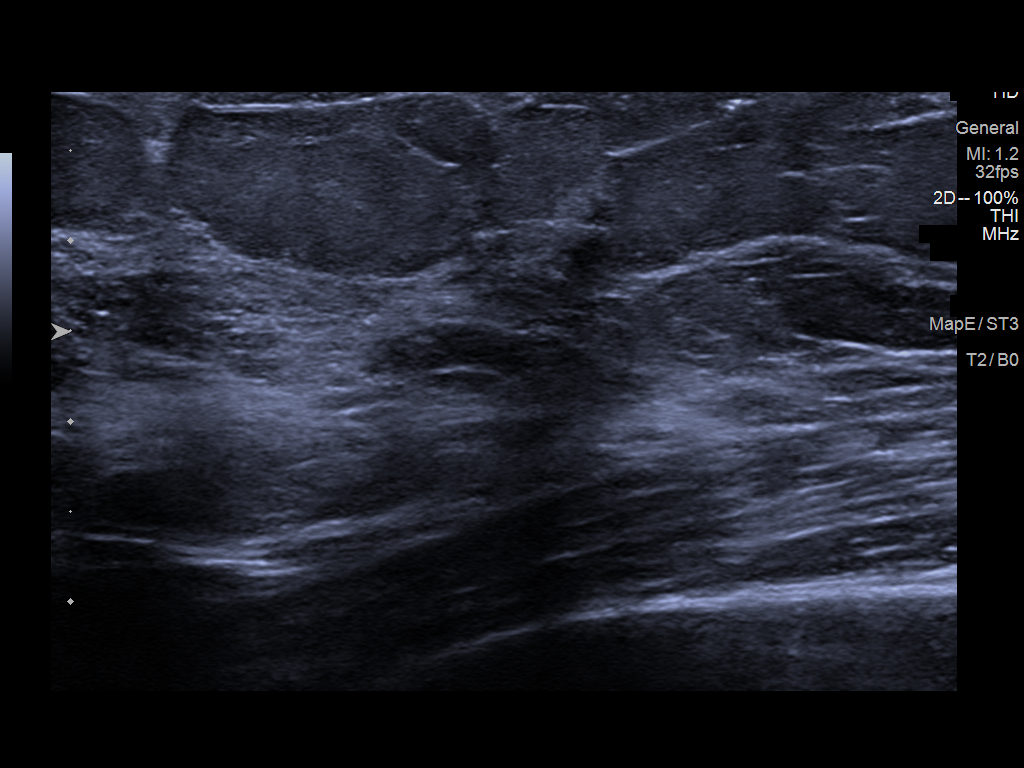
[im 2/6]
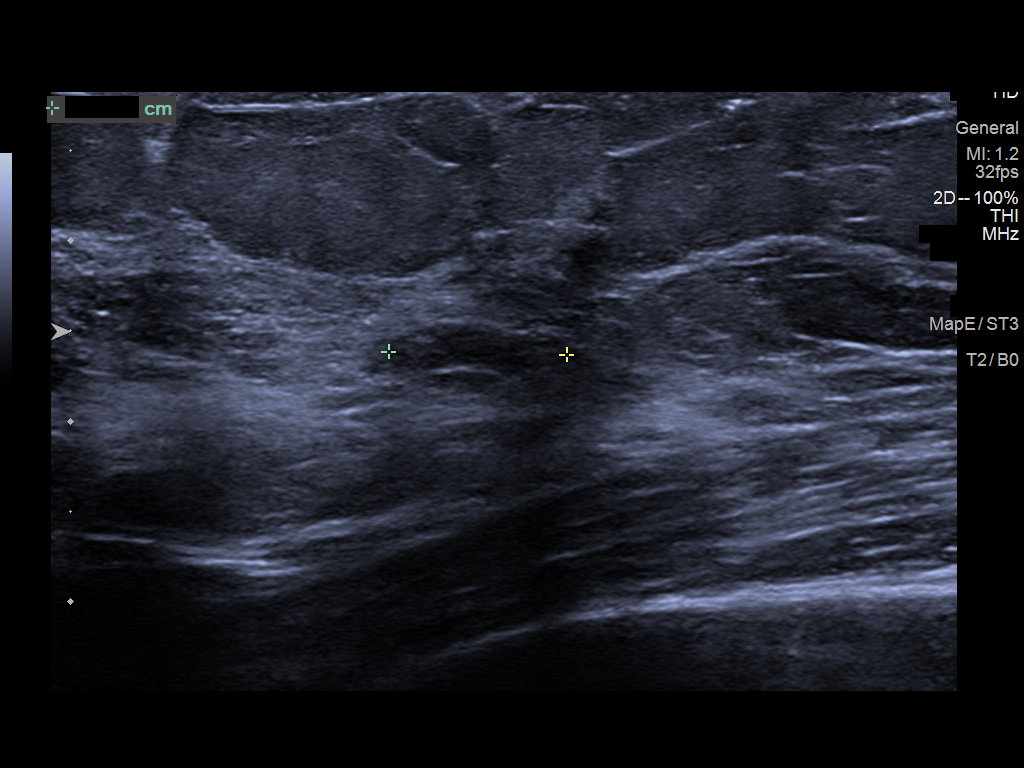
[im 3/6]
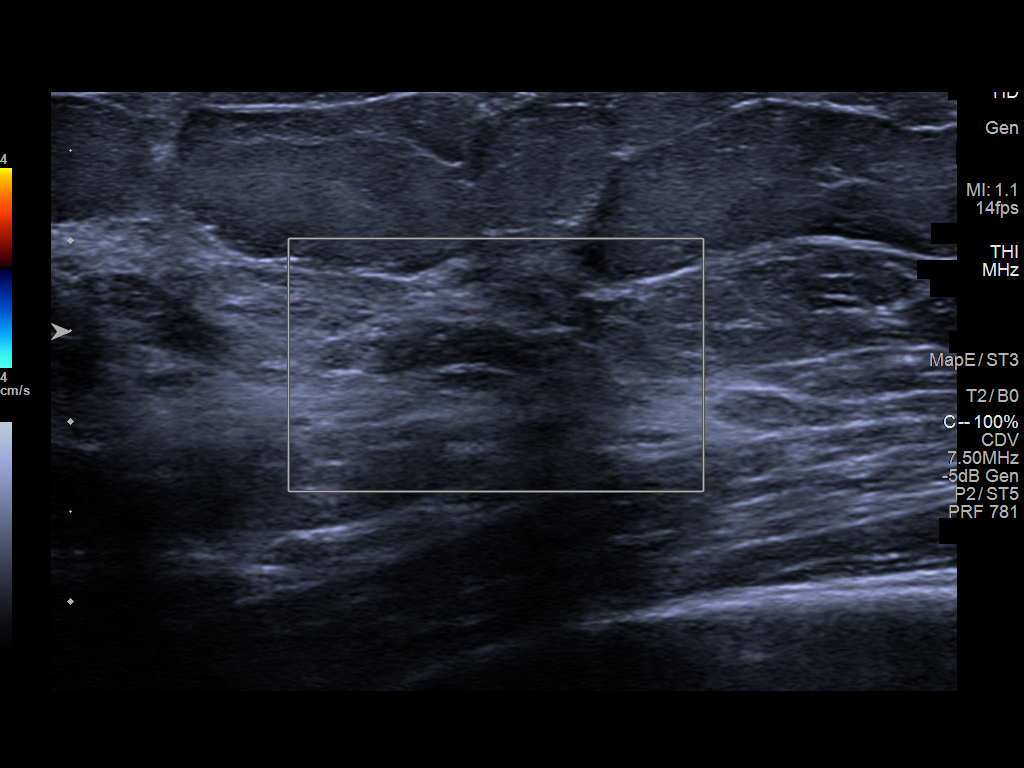
[im 4/6]
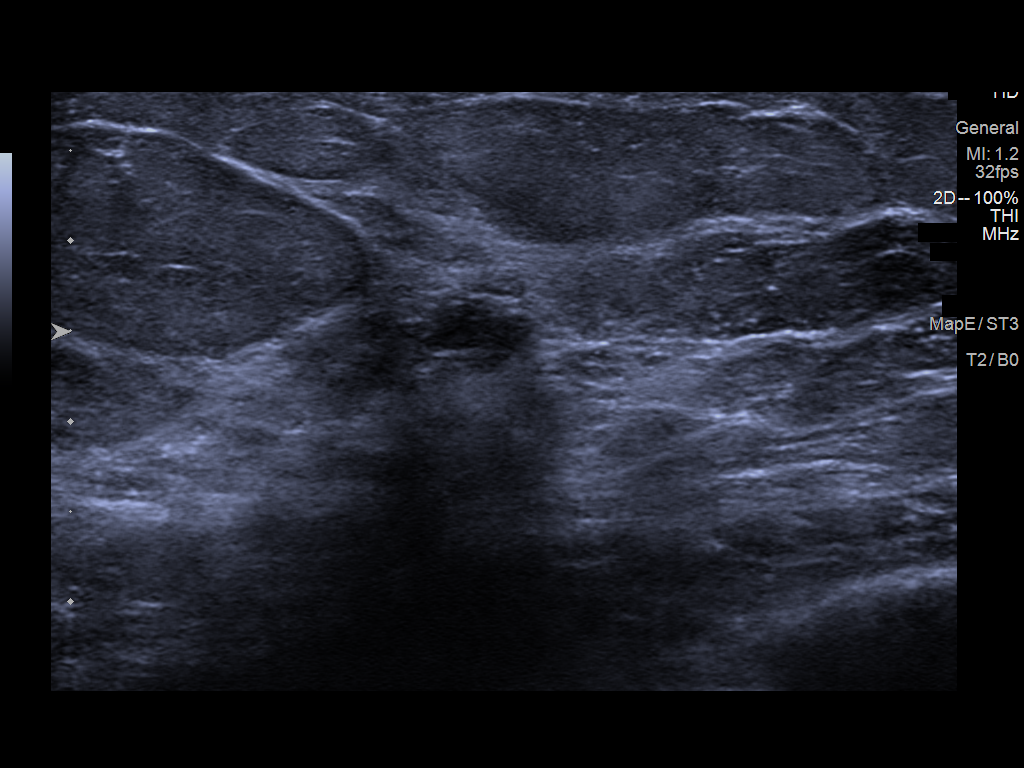
[im 5/6]
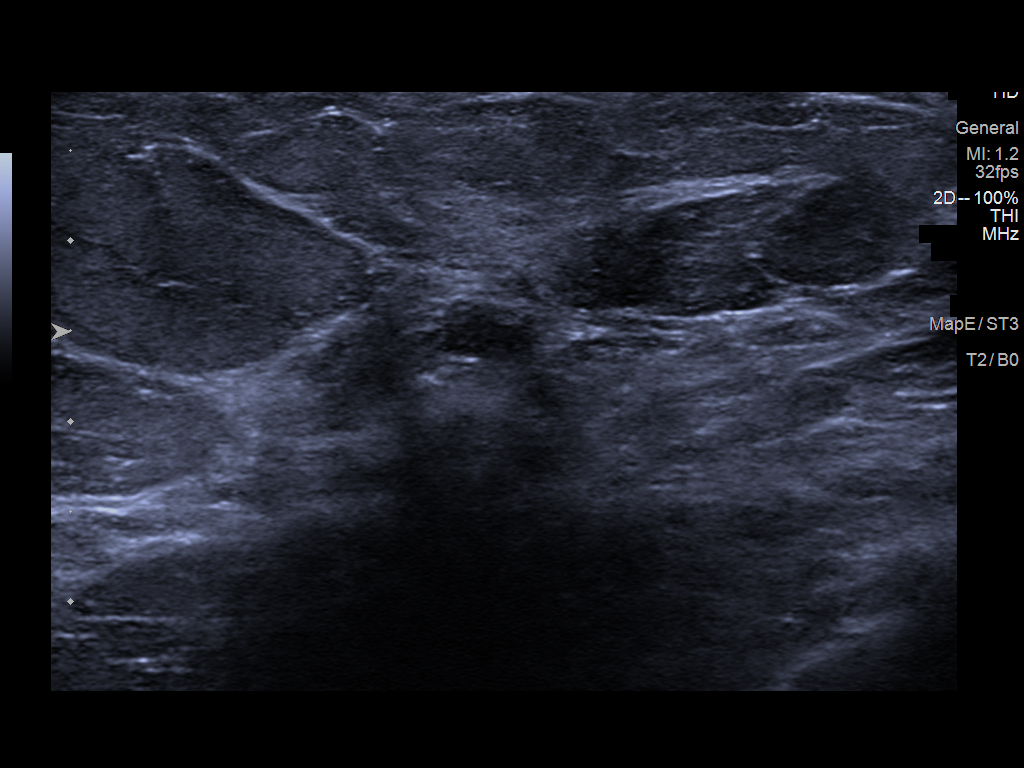
[im 6/6]
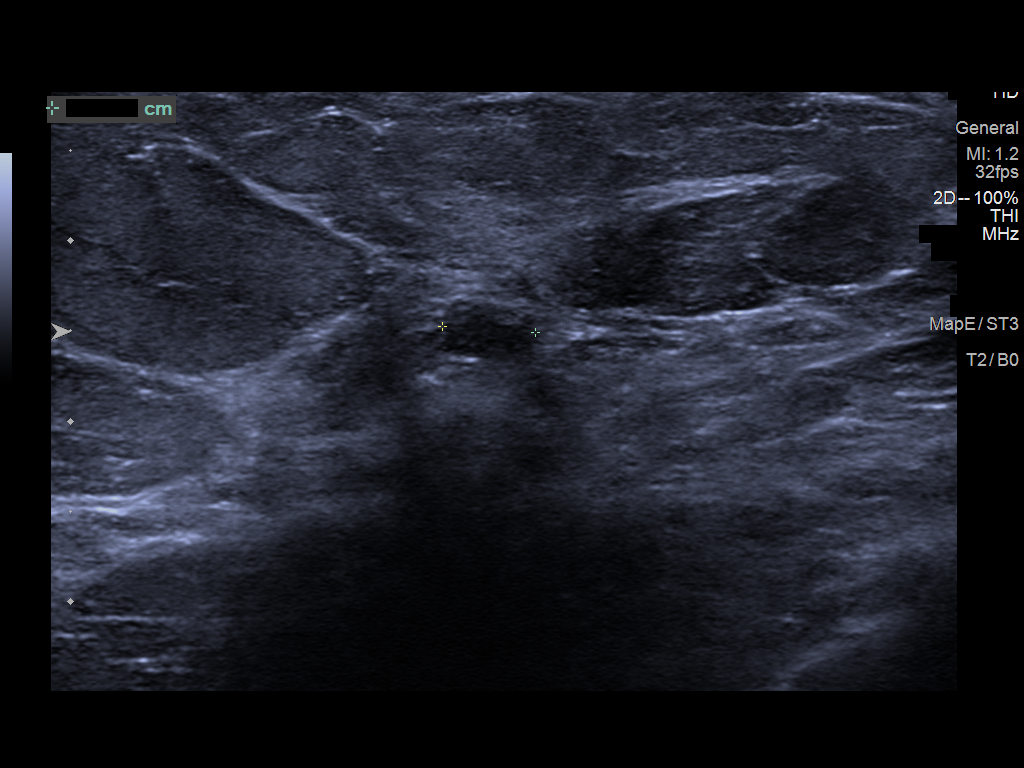

[6 of 6 positions shown; findings below may reference images not displayed]

ACR Breast Density Category b: There are scattered areas of
fibroglandular density.
FINDINGS: The mass in the upper-outer quadrant of the left breast, posterior
depth is unchanged. No suspicious mass or malignant type
microcalcifications identified.

Targeted ultrasound is performed, showing a stable hypoechoic mass
in the left breast at 2 o'clock 8 cm from the nipple measuring 8 x 2
x 5 mm. Previously it measured 10 x 2 x 5 mm.
IMPRESSION: Stable probable benign findings in the left breast.

RECOMMENDATION:
Bilateral diagnostic mammogram and possible left breast ultrasound
in [DATE] is recommended.

I have discussed the findings and recommendations with the patient.
If applicable, a reminder letter will be sent to the patient
regarding the next appointment.

BI-RADS CATEGORY  3: Probably benign.

## 2020-11-21 IMAGING — MG MM DIGITAL DIAGNOSTIC UNILAT*L* W/ TOMO W/ CAD
4 series · 4 of 12 positions shown · non-contrast
Comparison: Previous exam(s).

CLINICAL DATA: Short-term interval follow-up of a probable benign
mass in the left breast.

EXAM:
DIGITAL DIAGNOSTIC UNILATERAL LEFT MAMMOGRAM WITH TOMOSYNTHESIS AND
CAD; ULTRASOUND LEFT BREAST LIMITED
TECHNIQUE: Left digital diagnostic mammography and breast tomosynthesis was
performed. The images were evaluated with computer-aided detection.;
Targeted ultrasound examination of the left breast was performed.

[L MLO synth-2D]
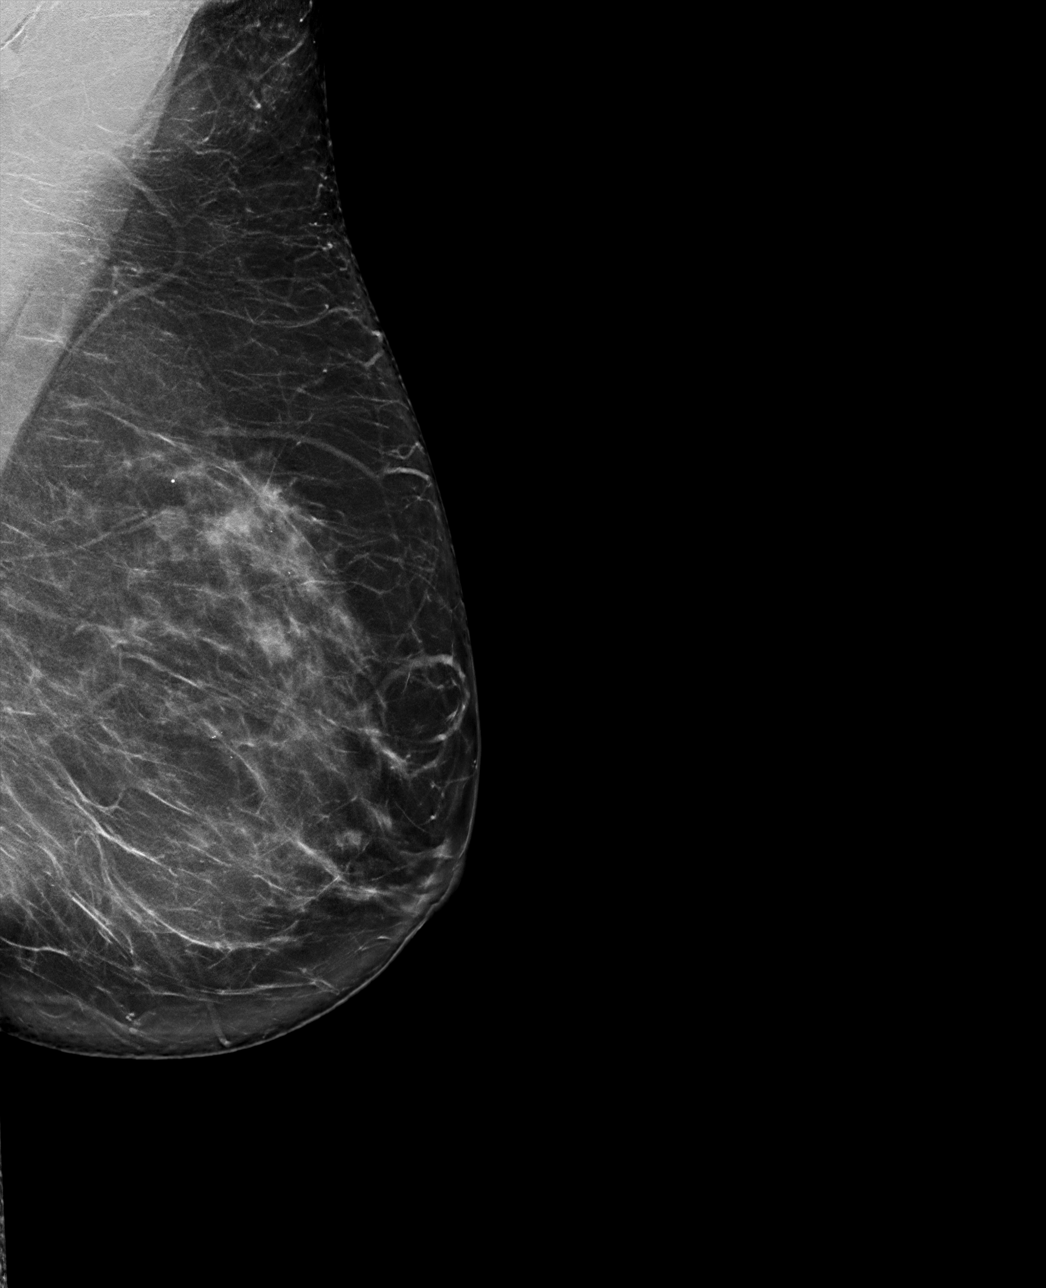

[L CC synth-2D]
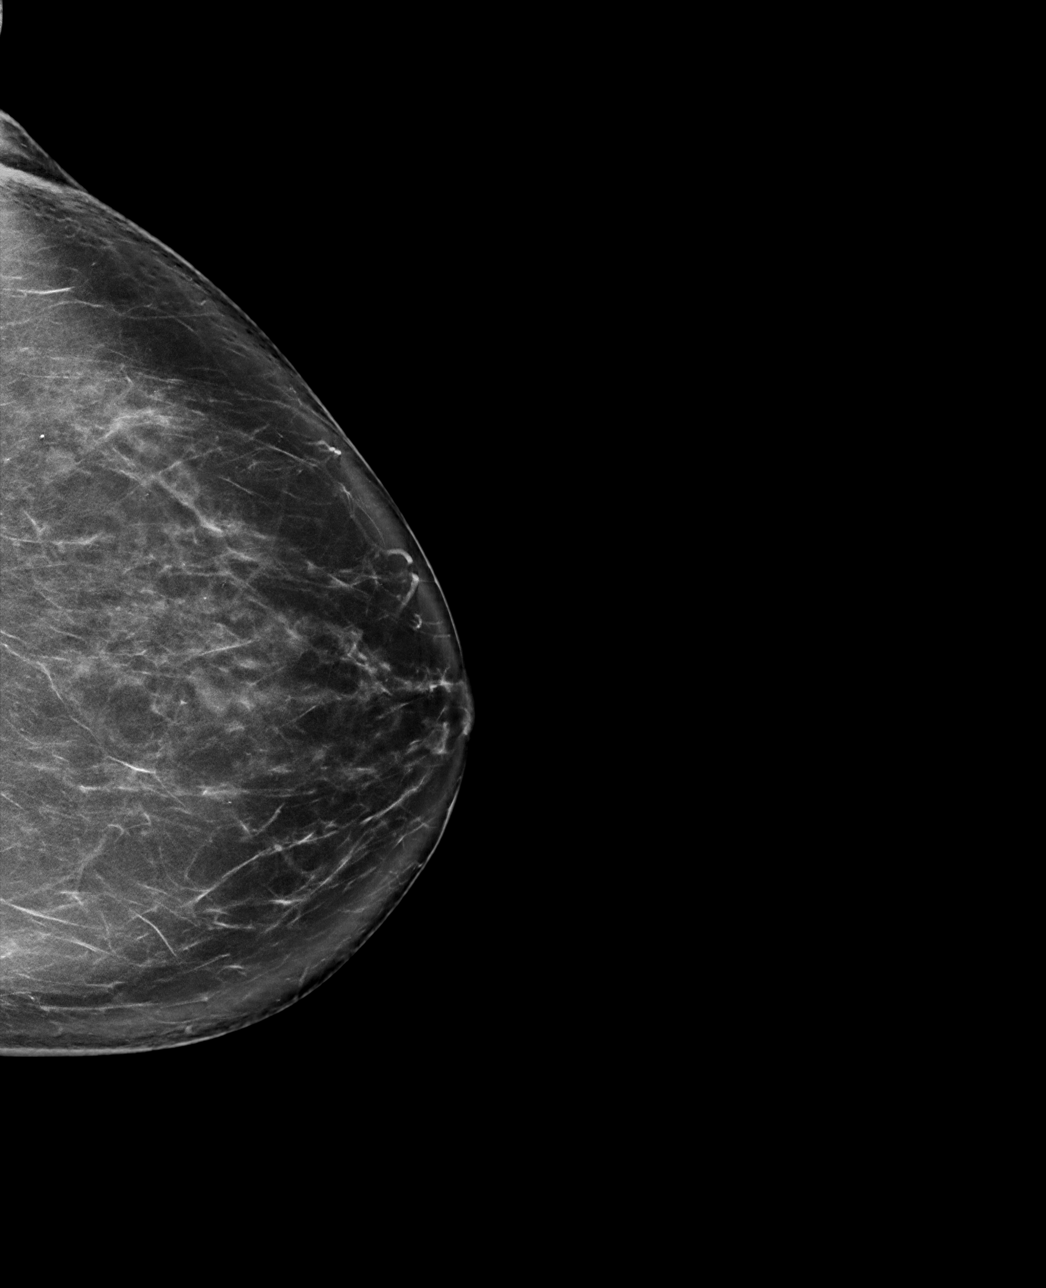

[L CC tomo · tomo slice 43/84.0]
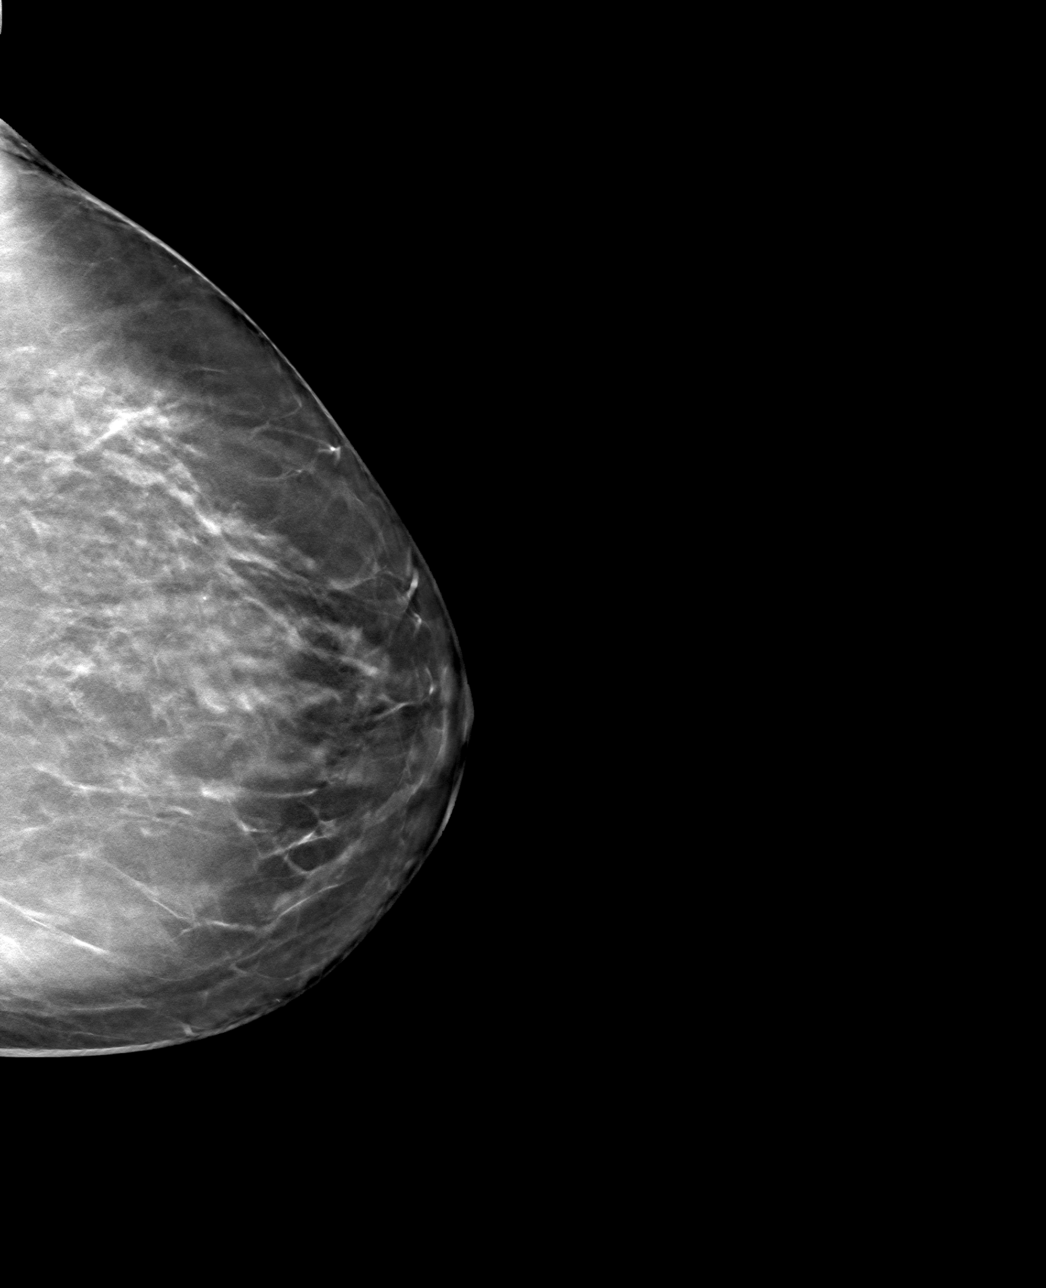

[L MLO tomo · tomo slice 43/85.0]
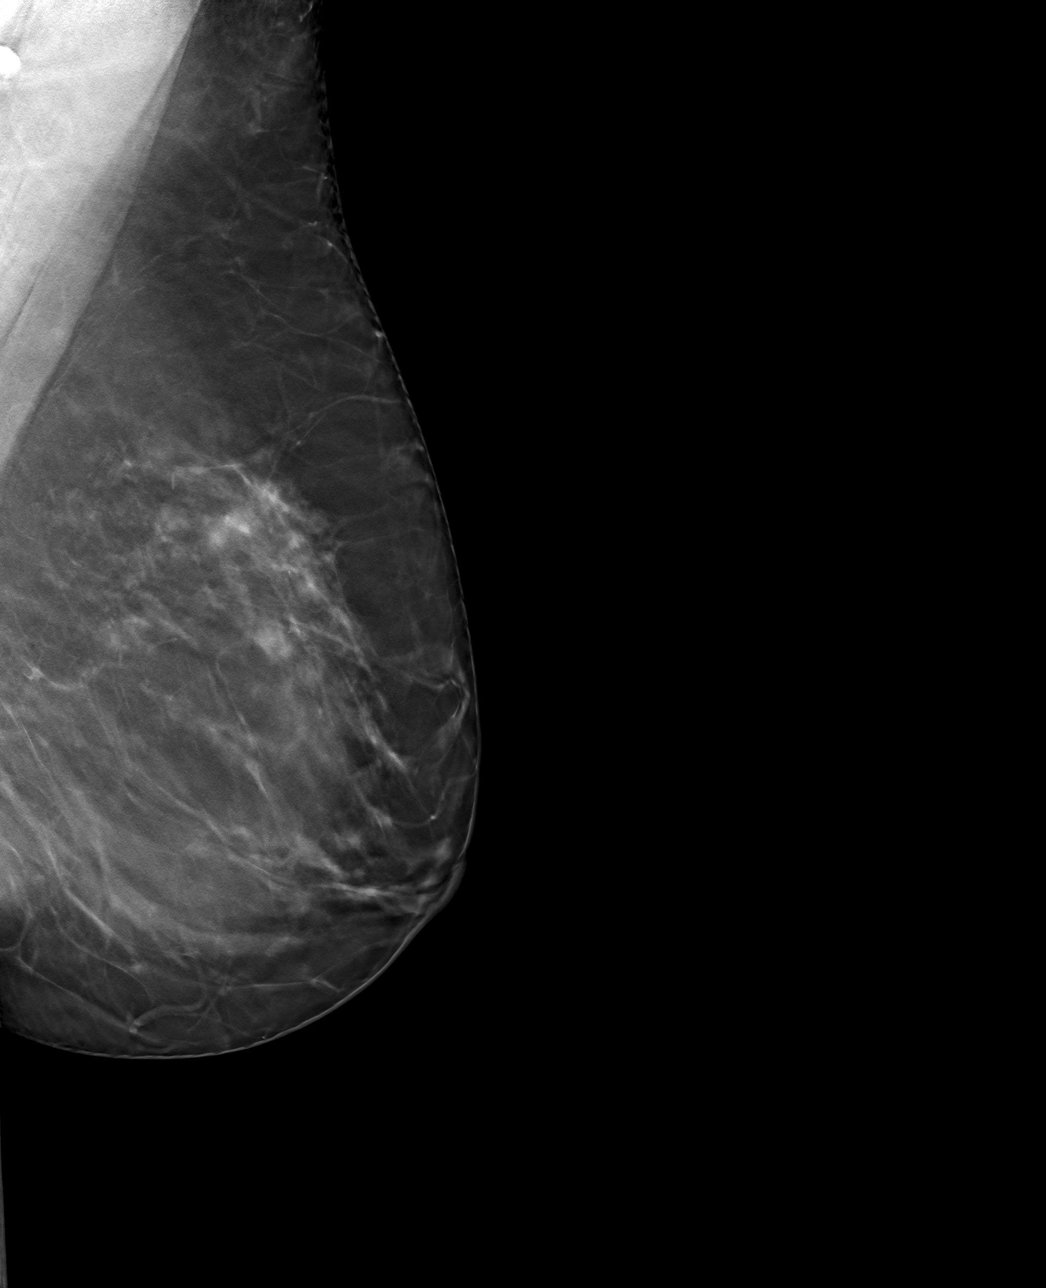

[4 of 12 positions shown; findings below may reference images not displayed]

ACR Breast Density Category b: There are scattered areas of
fibroglandular density.
FINDINGS: The mass in the upper-outer quadrant of the left breast, posterior
depth is unchanged. No suspicious mass or malignant type
microcalcifications identified.

Targeted ultrasound is performed, showing a stable hypoechoic mass
in the left breast at 2 o'clock 8 cm from the nipple measuring 8 x 2
x 5 mm. Previously it measured 10 x 2 x 5 mm.
IMPRESSION: Stable probable benign findings in the left breast.

RECOMMENDATION:
Bilateral diagnostic mammogram and possible left breast ultrasound
in [DATE] is recommended.

I have discussed the findings and recommendations with the patient.
If applicable, a reminder letter will be sent to the patient
regarding the next appointment.

BI-RADS CATEGORY  3: Probably benign.

## 2020-11-23 ENCOUNTER — Other Ambulatory Visit: Payer: Self-pay | Admitting: Obstetrics and Gynecology

## 2020-11-23 DIAGNOSIS — N6320 Unspecified lump in the left breast, unspecified quadrant: Secondary | ICD-10-CM

## 2021-05-24 ENCOUNTER — Other Ambulatory Visit (HOSPITAL_COMMUNITY): Payer: Self-pay | Admitting: Internal Medicine

## 2021-05-24 ENCOUNTER — Other Ambulatory Visit: Payer: Self-pay | Admitting: Internal Medicine

## 2021-05-24 DIAGNOSIS — K863 Pseudocyst of pancreas: Secondary | ICD-10-CM

## 2021-05-25 ENCOUNTER — Ambulatory Visit
Admission: RE | Admit: 2021-05-25 | Discharge: 2021-05-25 | Disposition: A | Discharge status: Home / Self Care | Payer: BC Managed Care – PPO | Source: Ambulatory Visit | Attending: Obstetrics and Gynecology | Admitting: Obstetrics and Gynecology

## 2021-05-25 ENCOUNTER — Other Ambulatory Visit: Payer: Self-pay

## 2021-05-25 DIAGNOSIS — N6320 Unspecified lump in the left breast, unspecified quadrant: Secondary | ICD-10-CM | POA: Insufficient documentation

## 2021-05-25 IMAGING — US US BREAST*L* LIMITED INC AXILLA
1 series · 8 of 8 positions shown · non-contrast
Comparison: Previous exam(s).

CLINICAL DATA: 55-year-old female presenting for annual bilateral
mammogram and 1 year follow-up of a probably benign left breast
mass.

EXAM:
DIGITAL DIAGNOSTIC BILATERAL MAMMOGRAM WITH TOMOSYNTHESIS AND CAD;
ULTRASOUND LEFT BREAST LIMITED
TECHNIQUE: Bilateral digital diagnostic mammography and breast tomosynthesis
was performed. The images were evaluated with computer-aided
detection.; Targeted ultrasound examination of the left breast was
performed.

[Series 1: us breast*left* limited inc axilla · 0.06mm/px · 8 of 8 slices shown]
[im 1/8]
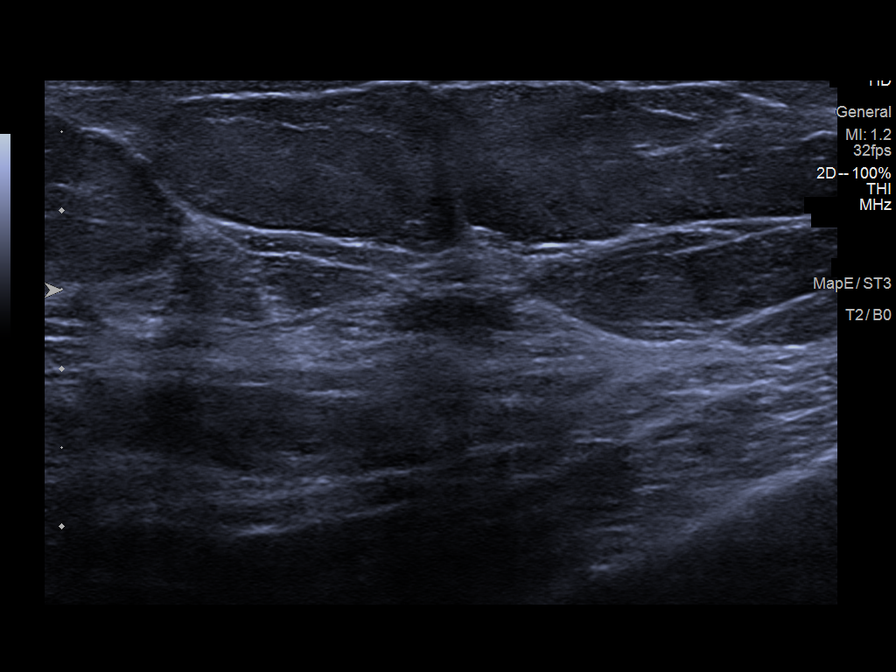
[im 2/8]
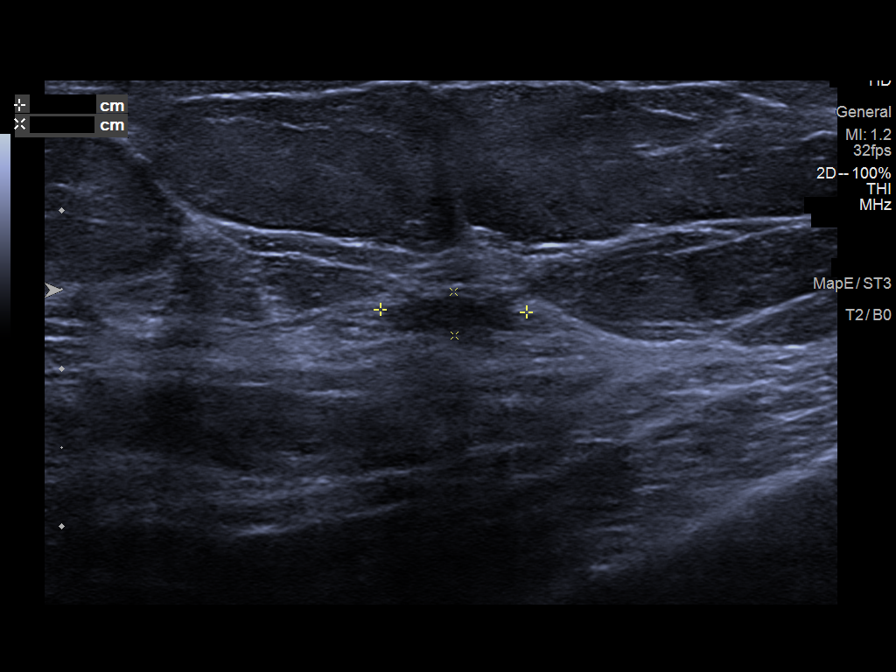
[im 3/8]
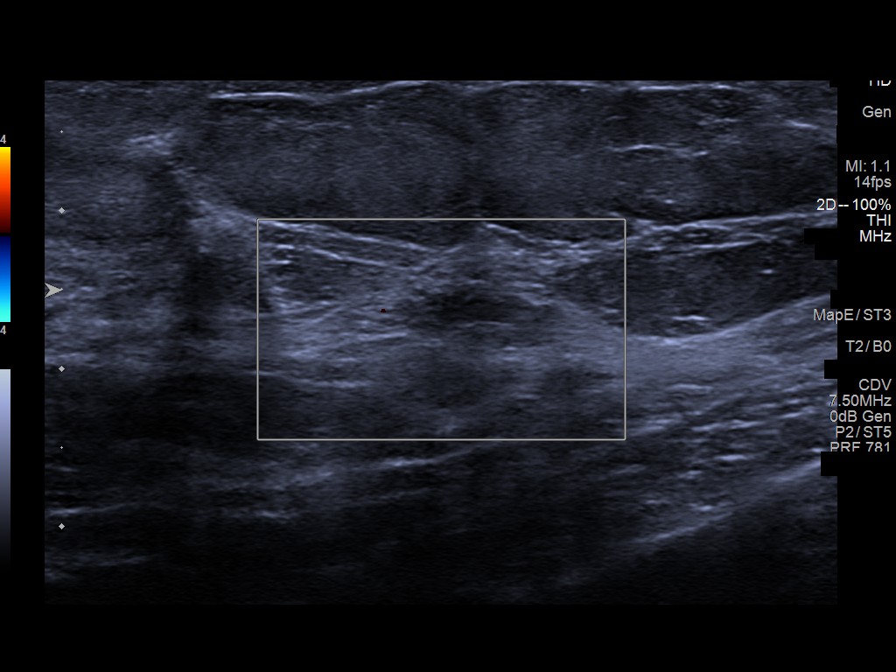
[im 4/8]
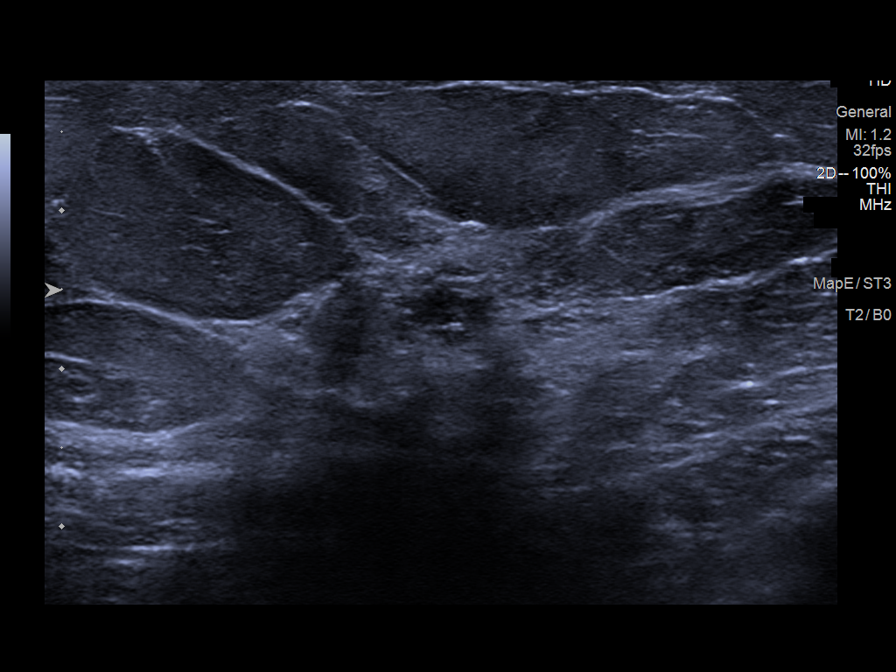
[im 5/8]
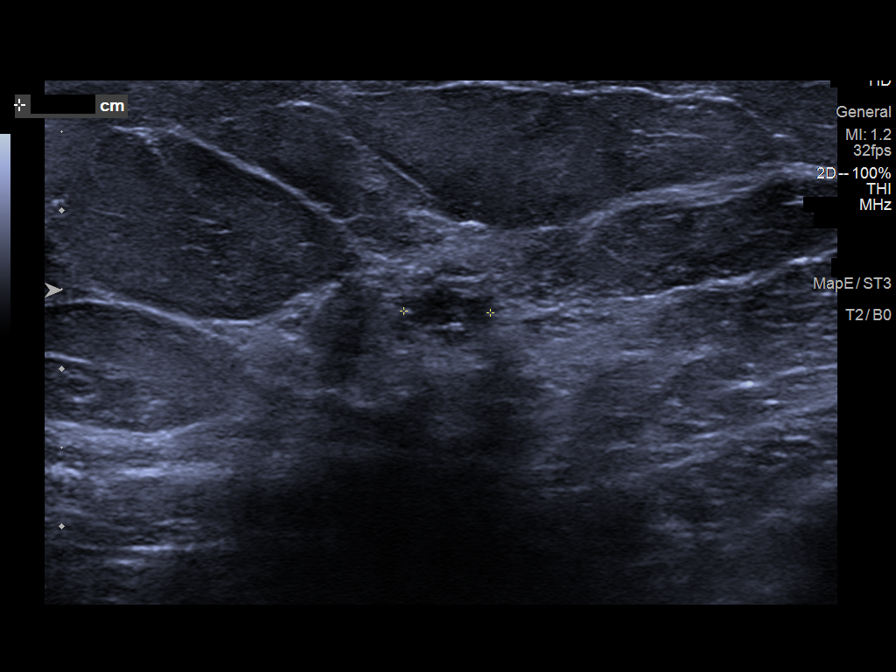
[im 6/8]
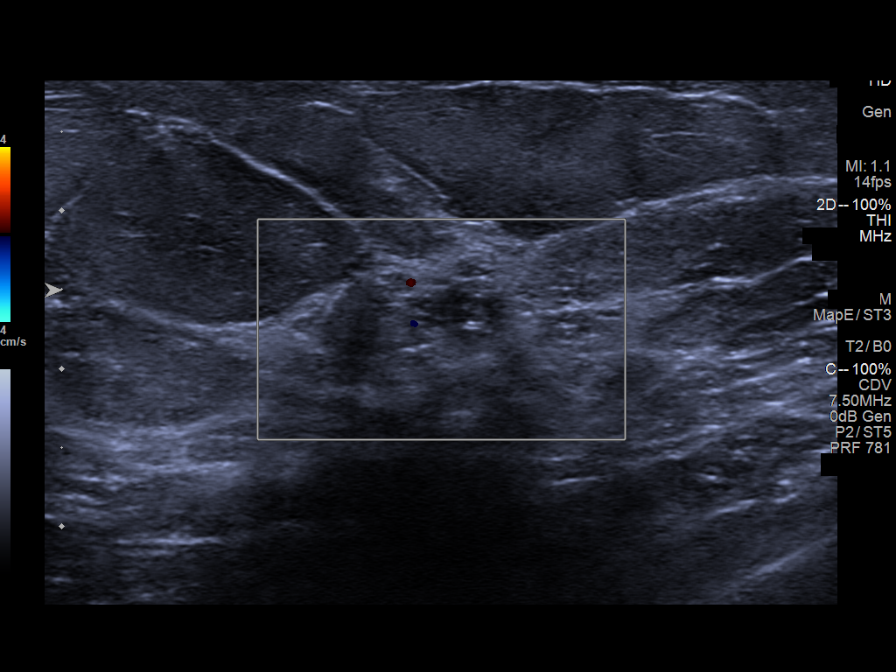
[im 7/8]
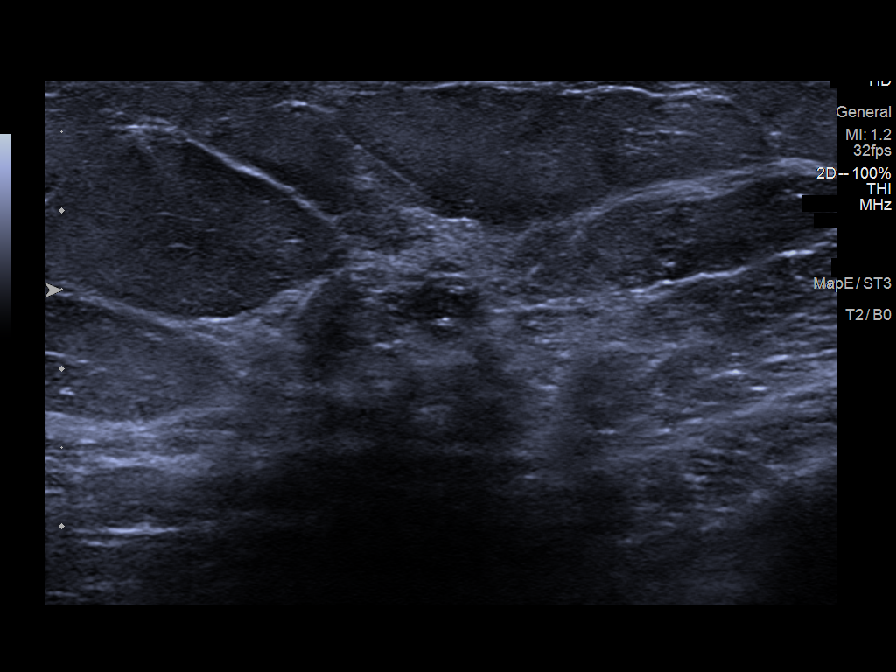
[im 8/8]
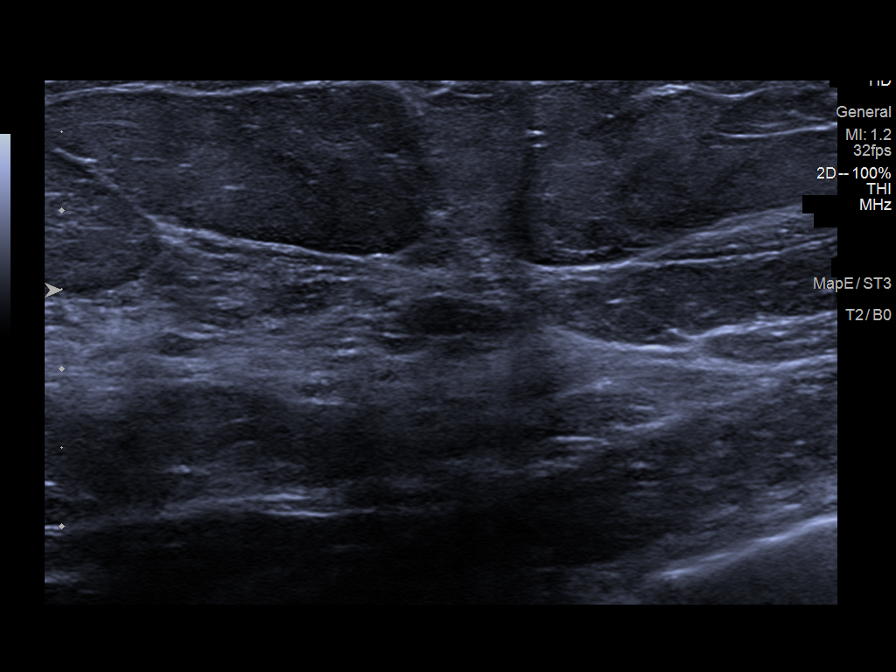

[8 of 8 positions shown; findings below may reference images not displayed]

ACR Breast Density Category b: There are scattered areas of
fibroglandular density.
FINDINGS: An oval, circumscribed low-density mass in the outer left breast is
mammographically stable. Otherwise, no new or suspicious findings in
either breast. The parenchymal pattern is stable.

Targeted ultrasound is performed, showing stable appearance of an
oval, hypoechoic mass at the deep 2 o'clock position 8 cm from the
nipple on the left. Today it measures 9 x 3 x 6 mm (previously 10 x
2 x 5 mm).
IMPRESSION: 1. Stable, probably benign left breast mass. Recommend a final
follow-up in 1 year.
2. No mammographic evidence of malignancy on the right.

RECOMMENDATION:
Bilateral diagnostic mammogram and left breast ultrasound in 1 year.

I have discussed the findings and recommendations with the patient.
If applicable, a reminder letter will be sent to the patient
regarding the next appointment.

BI-RADS CATEGORY  3: Probably benign.

## 2021-05-25 IMAGING — MG DIGITAL DIAGNOSTIC BILAT W/ TOMO W/ CAD
8 series · 8 of 24 positions shown · non-contrast
Comparison: Previous exam(s).

CLINICAL DATA: 55-year-old female presenting for annual bilateral
mammogram and 1 year follow-up of a probably benign left breast
mass.

EXAM:
DIGITAL DIAGNOSTIC BILATERAL MAMMOGRAM WITH TOMOSYNTHESIS AND CAD;
ULTRASOUND LEFT BREAST LIMITED
TECHNIQUE: Bilateral digital diagnostic mammography and breast tomosynthesis
was performed. The images were evaluated with computer-aided
detection.; Targeted ultrasound examination of the left breast was
performed.

[R CC synth-2D]
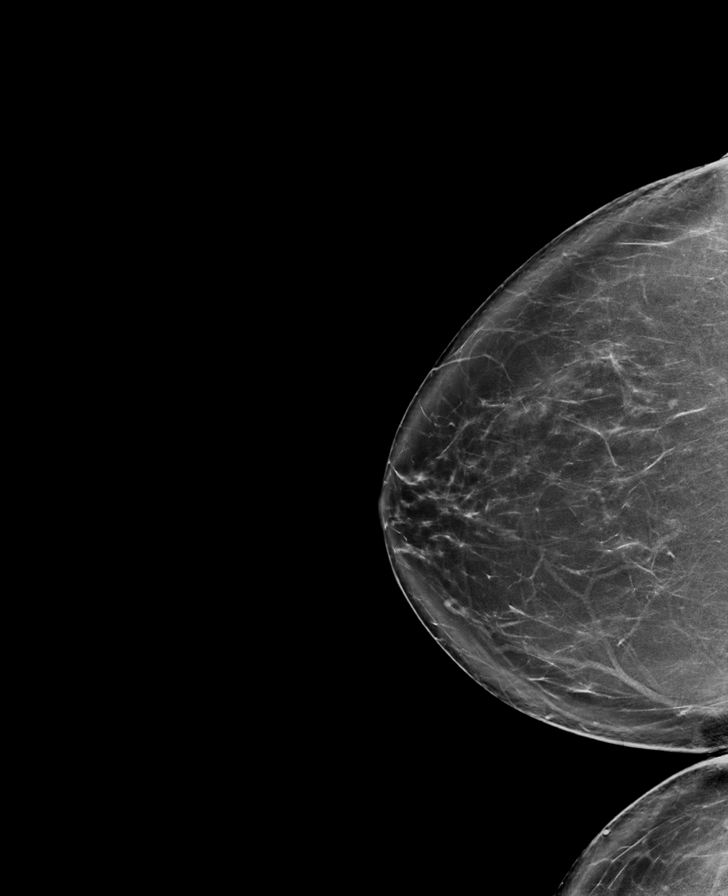

[L MLO synth-2D]
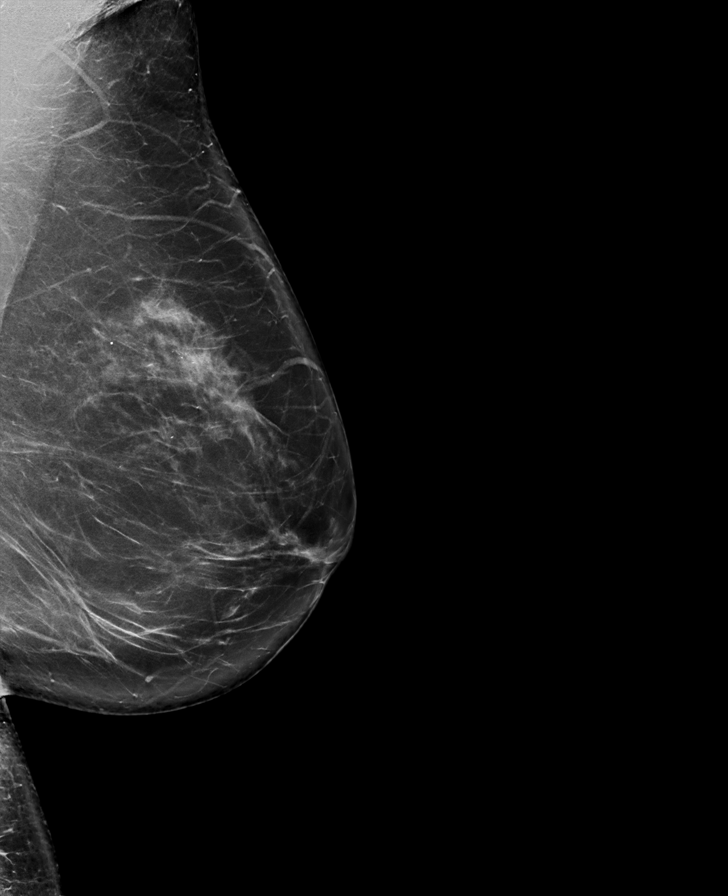

[R MLO synth-2D]
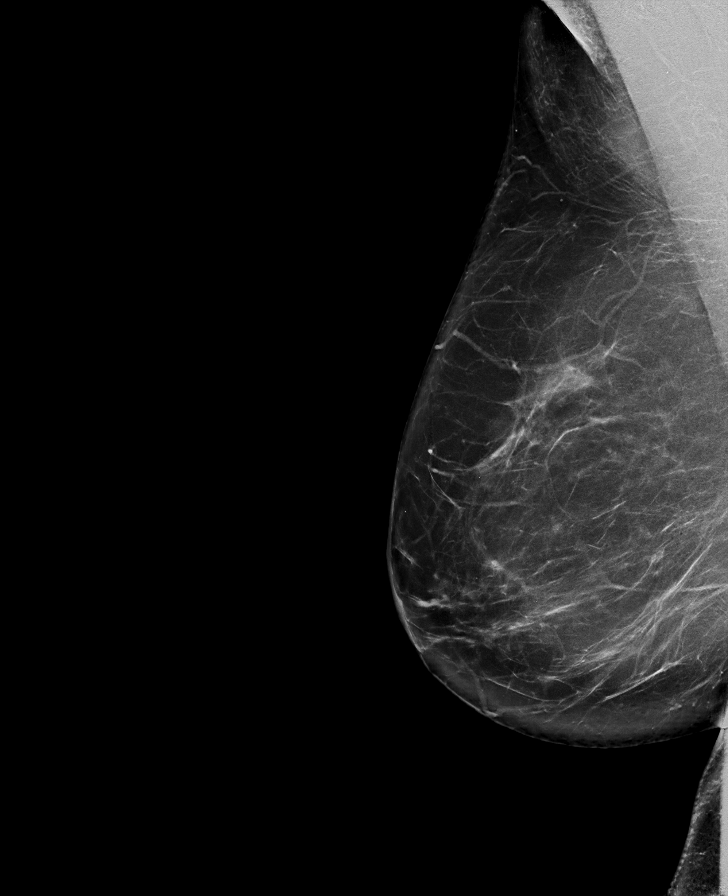

[L CC synth-2D]
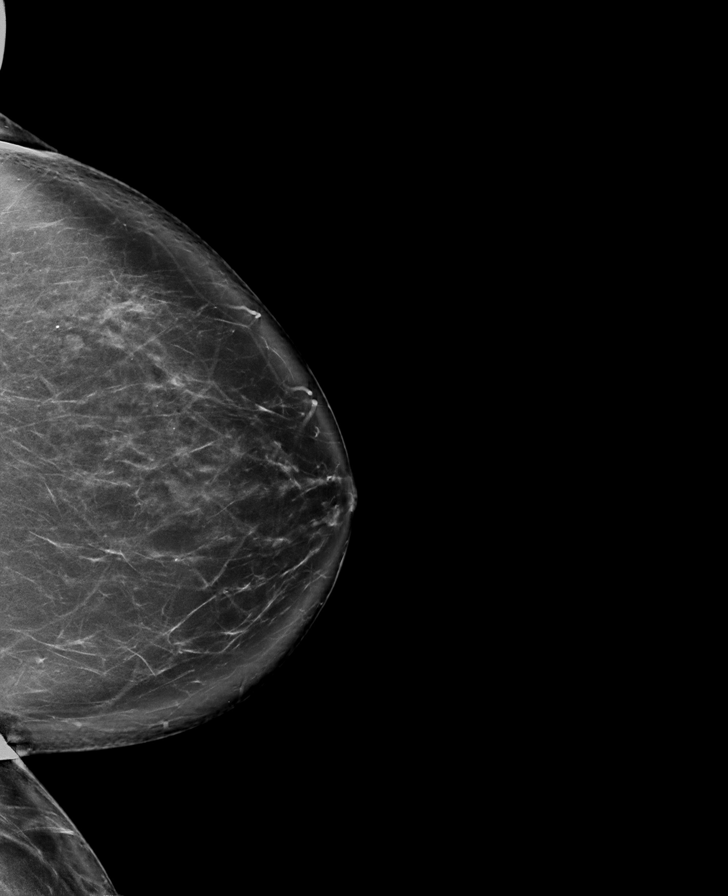

[L MLO tomo · tomo slice 46/91.0]
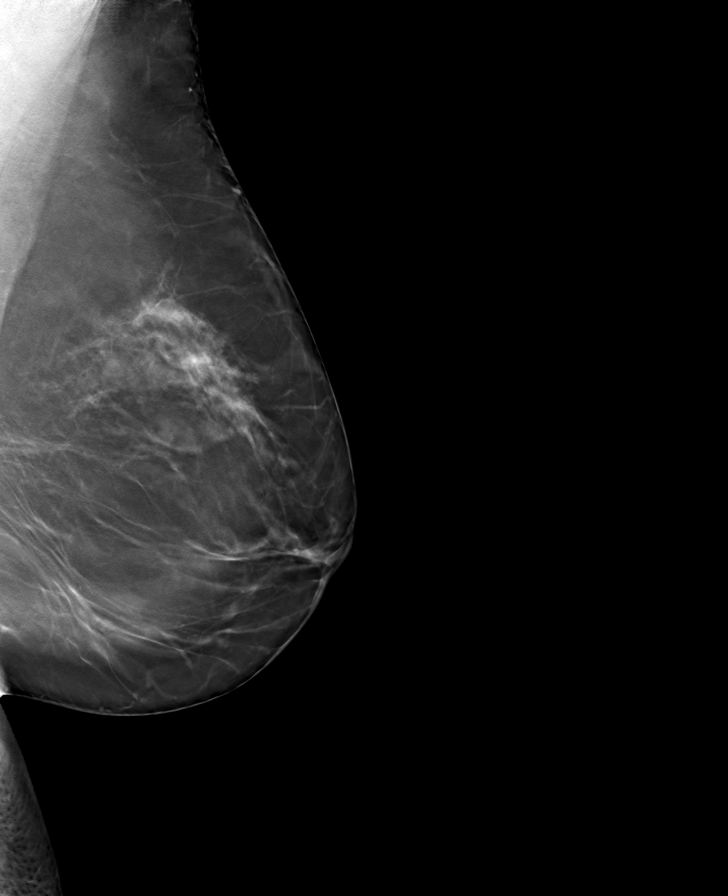

[L CC tomo · tomo slice 47/93.0]
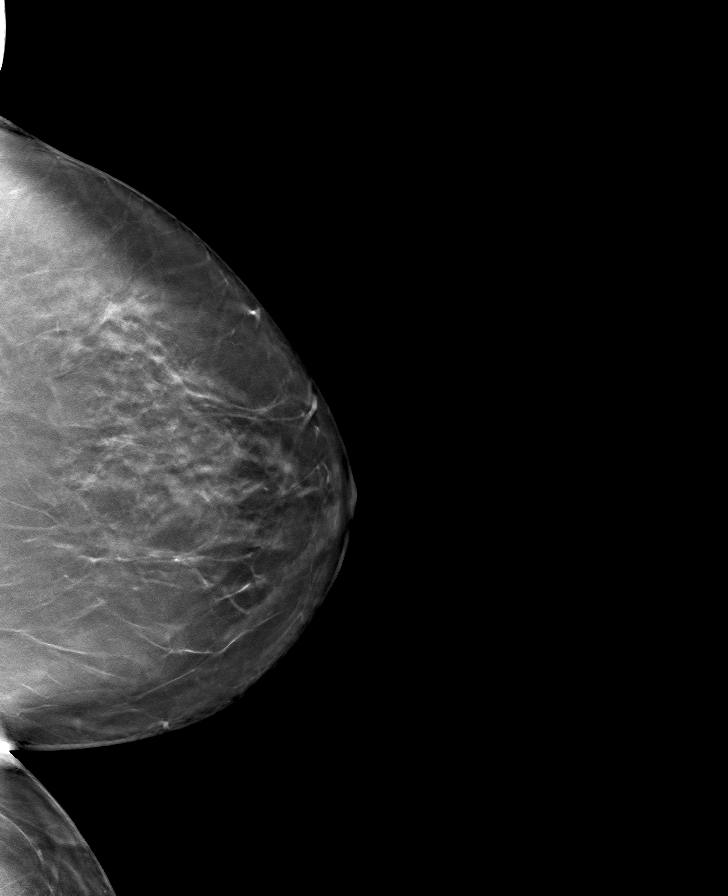

[R MLO tomo · tomo slice 45/90.0]
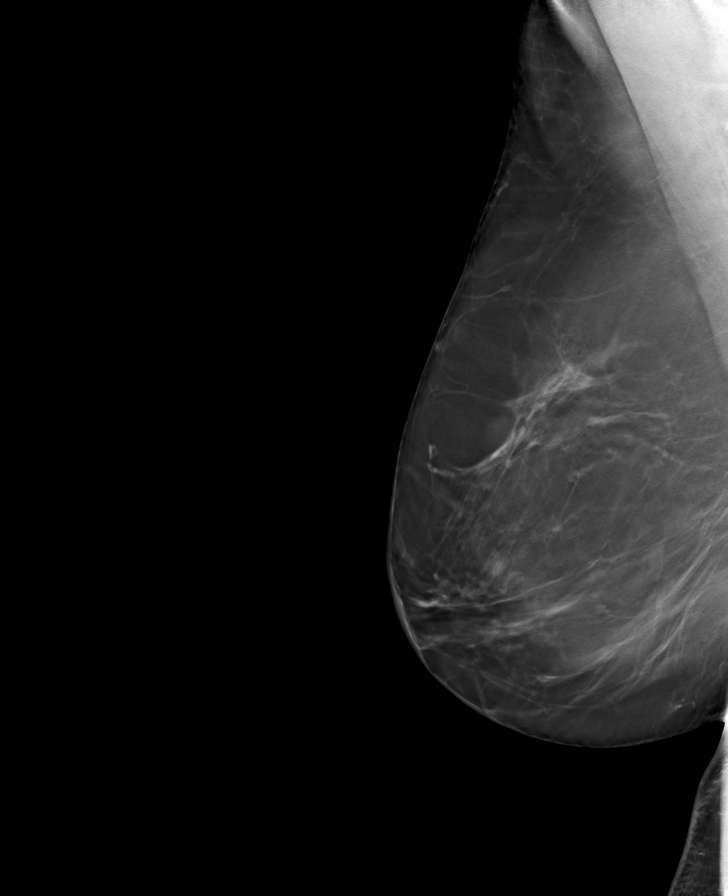

[R CC tomo · tomo slice 45/88.0]
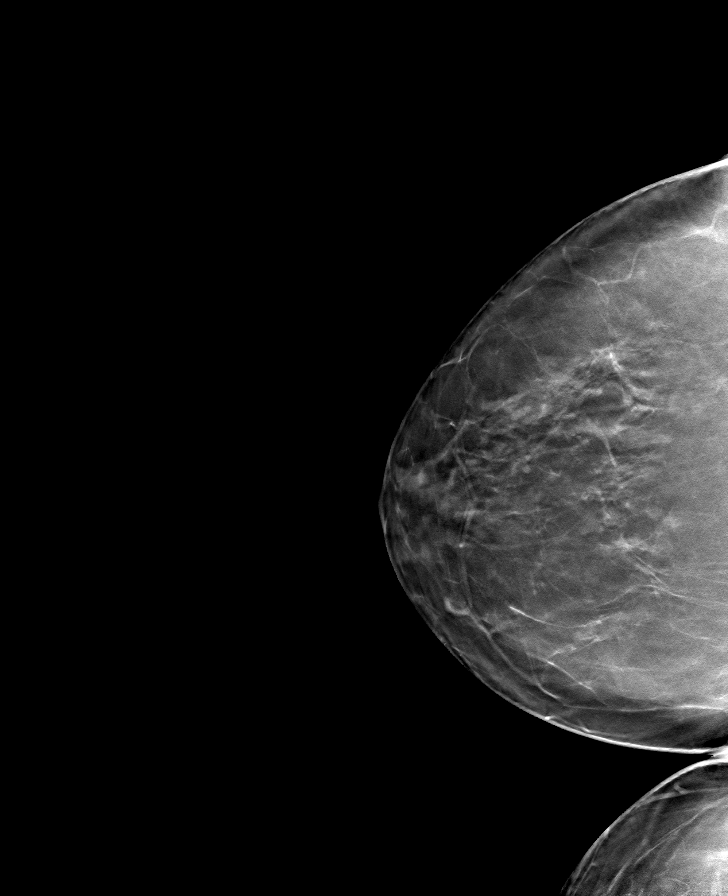

[8 of 24 positions shown; findings below may reference images not displayed]

ACR Breast Density Category b: There are scattered areas of
fibroglandular density.
FINDINGS: An oval, circumscribed low-density mass in the outer left breast is
mammographically stable. Otherwise, no new or suspicious findings in
either breast. The parenchymal pattern is stable.

Targeted ultrasound is performed, showing stable appearance of an
oval, hypoechoic mass at the deep 2 o'clock position 8 cm from the
nipple on the left. Today it measures 9 x 3 x 6 mm (previously 10 x
2 x 5 mm).
IMPRESSION: 1. Stable, probably benign left breast mass. Recommend a final
follow-up in 1 year.
2. No mammographic evidence of malignancy on the right.

RECOMMENDATION:
Bilateral diagnostic mammogram and left breast ultrasound in 1 year.

I have discussed the findings and recommendations with the patient.
If applicable, a reminder letter will be sent to the patient
regarding the next appointment.

BI-RADS CATEGORY  3: Probably benign.

## 2021-06-06 ENCOUNTER — Other Ambulatory Visit: Payer: Self-pay

## 2021-06-06 ENCOUNTER — Ambulatory Visit: Payer: BC Managed Care – PPO

## 2021-06-06 ENCOUNTER — Ambulatory Visit
Admission: RE | Admit: 2021-06-06 | Discharge: 2021-06-06 | Disposition: A | Discharge status: Home / Self Care | Payer: BC Managed Care – PPO | Source: Ambulatory Visit | Attending: Internal Medicine | Admitting: Internal Medicine

## 2021-06-06 DIAGNOSIS — K863 Pseudocyst of pancreas: Secondary | ICD-10-CM

## 2021-06-06 IMAGING — MR MR ABDOMEN WO/W CM
18 of 21 series · 44 of 48 positions shown · IV contrast (6ml Gadavist)
Comparison: CT [DATE] and MRI [DATE]

CLINICAL DATA: One year follow-up for cystic pancreatic lesion.

EXAM:
MRI ABDOMEN WITHOUT AND WITH CONTRAST
TECHNIQUE: Multiplanar multisequence MR imaging of the abdomen was performed
both before and after the administration of intravenous contrast.
CONTRAST:  6mL GADAVIST GADOBUTROL 1 MMOL/ML IV SOLN

[Series 3: T2 · coronal · 6.0mm · 1.19mm/px · 1 of 30 slices shown (1 of 2)]
[im 1/30]
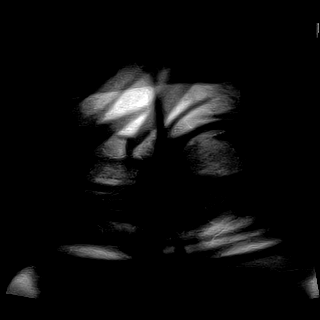

[Series 4: T2 · axial · 6.0mm · 1.19mm/px · 1 of 32 slices shown (2 of 2)]
[im 1/32]
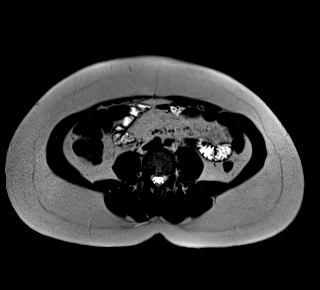

[Series 6: T2 fat-sat · axial · 6.0mm · 1.19mm/px · 1 of 34 slices shown]
[im 1/34]
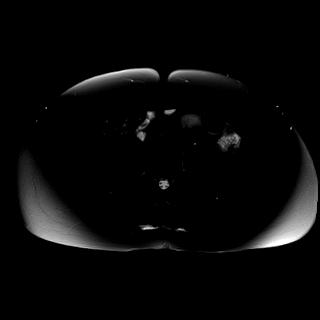

[Series 7: ax dwi_tracew · axial · 6.0mm · 1.42mm/px · z∈[-78,+160]mm · 3 of 102 slices shown]
[im 1/102]
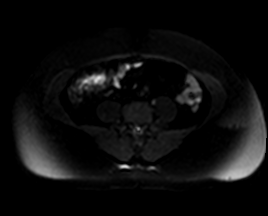
[im 51/102]
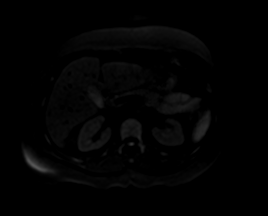
[im 102/102]
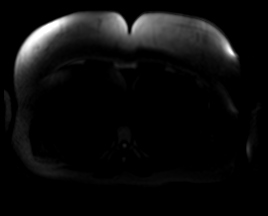

[Series 8: ax dwi_adc · axial · 6.0mm · 1.42mm/px · 1 of 34 slices shown]
[im 1/34]
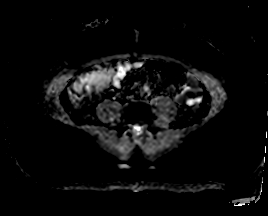

[Series 9: in & out · axial · 3.0mm · 1.19mm/px · z∈[-51,+162]mm · 3 of 72 slices shown (1 of 2)]
[im 1/72]
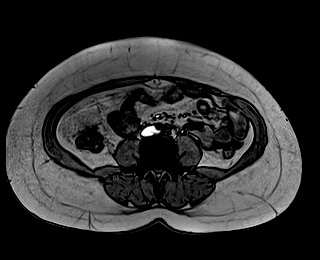
[im 36/72]
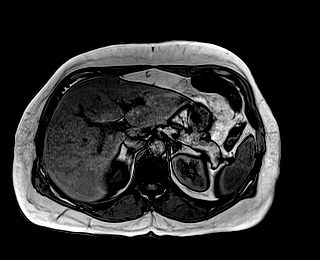
[im 72/72]
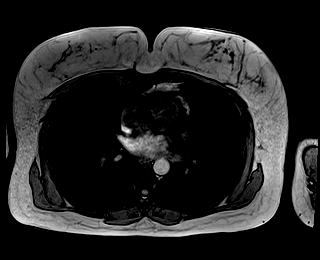

[Series 10: in & out · axial · 3.0mm · 1.19mm/px · z∈[-51,+162]mm · 3 of 72 slices shown (2 of 2)]
[im 1/72]
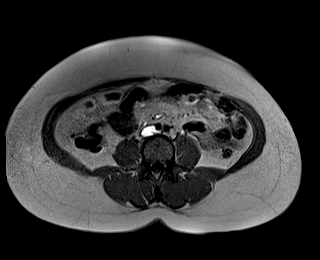
[im 36/72]
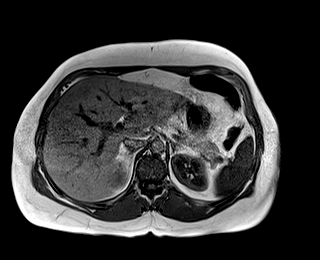
[im 72/72]
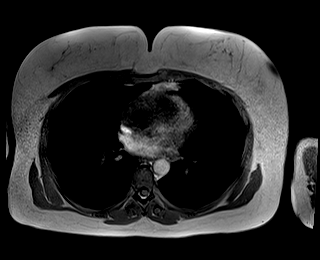

[Series 11: bSSFP · axial · 6.0mm · 0.74mm/px · 1 of 32 slices shown]
[im 1/32]
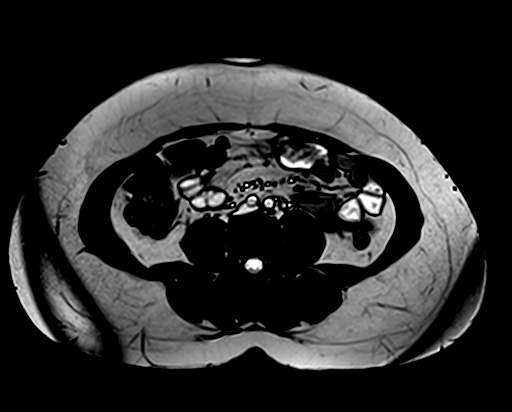

[Series 12: T1 dynamic fat-sat · axial · non-contrast · 3.0mm · 1.19mm/px · z∈[-76,+161]mm · 3 of 80 slices shown (1 of 5)]
[im 1/80]
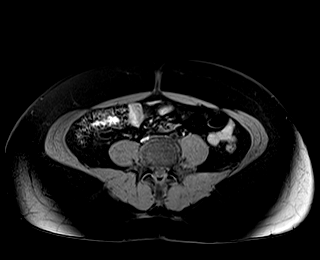
[im 40/80]
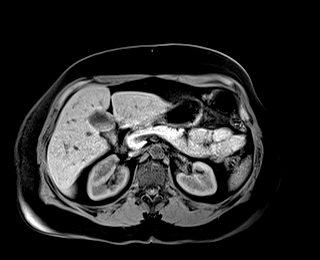
[im 80/80]
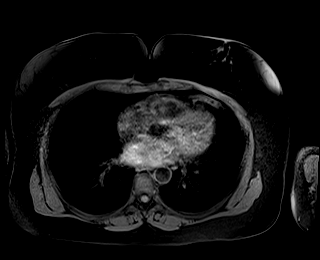

[Series 16: T1 dynamic fat-sat post-contrast · axial · 3.0mm · 1.19mm/px · z∈[-76,+161]mm · 3 of 80 slices shown (1 of 4)]
[im 1/80]
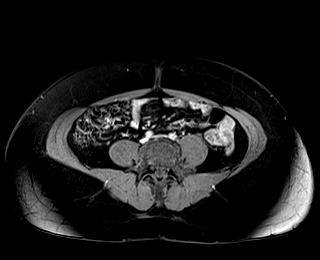
[im 40/80]
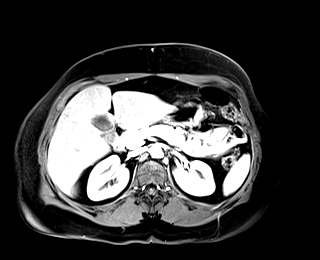
[im 80/80]
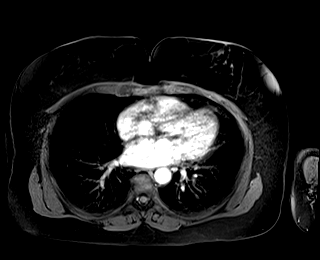

[Series 17: T1 dynamic fat-sat · axial · 3.0mm · 1.19mm/px · z∈[-76,+161]mm · 3 of 80 slices shown (2 of 5)]
[im 1/80]
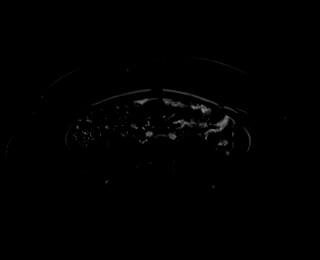
[im 40/80]
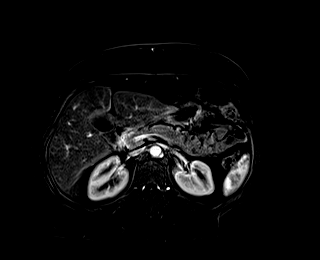
[im 80/80]
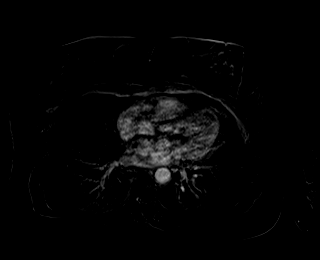

[Series 18: T1 dynamic fat-sat post-contrast · axial · 3.0mm · 1.19mm/px · z∈[-76,+161]mm · 3 of 80 slices shown (2 of 4)]
[im 1/80]
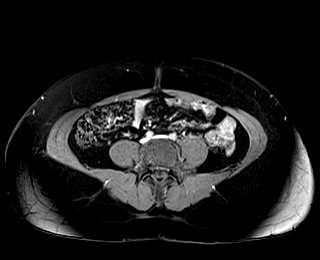
[im 40/80]
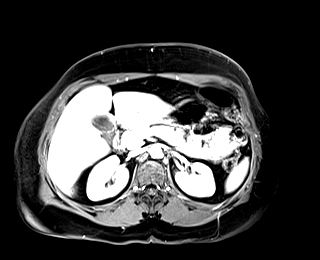
[im 80/80]
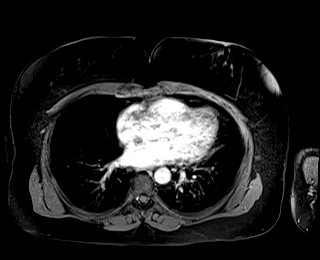

[Series 19: T1 dynamic fat-sat · axial · 3.0mm · 1.19mm/px · z∈[-76,+161]mm · 3 of 80 slices shown (3 of 5)]
[im 1/80]
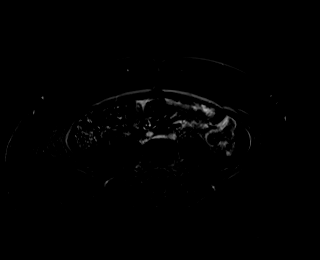
[im 40/80]
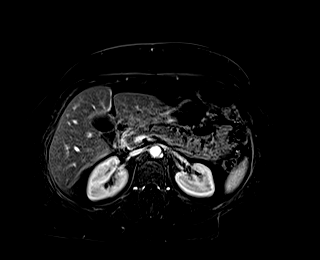
[im 80/80]
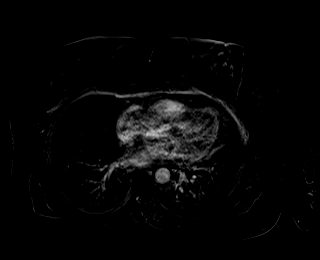

[Series 20: T1 dynamic fat-sat post-contrast · axial · 3.0mm · 1.19mm/px · z∈[-76,+161]mm · 3 of 80 slices shown (3 of 4)]
[im 1/80]
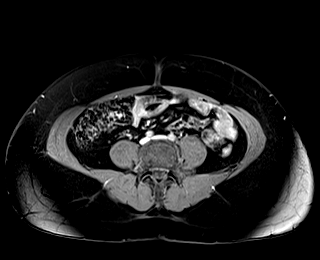
[im 40/80]
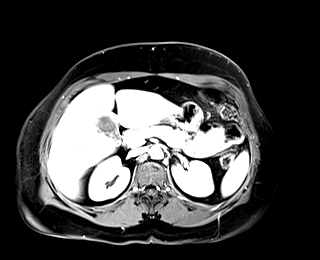
[im 80/80]
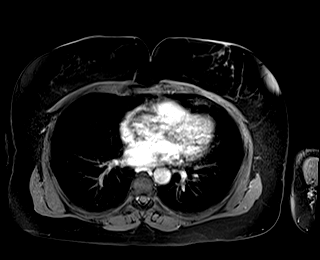

[Series 21: T1 dynamic fat-sat · axial · 3.0mm · 1.19mm/px · z∈[-76,+161]mm · 3 of 80 slices shown (4 of 5)]
[im 1/80]
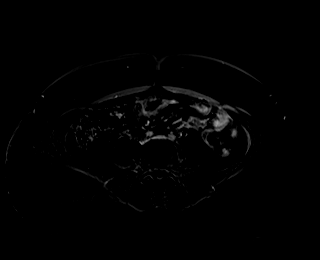
[im 40/80]
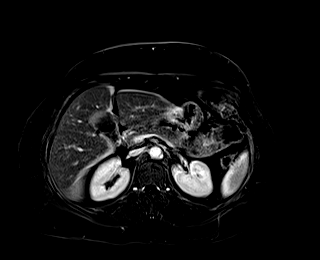
[im 80/80]
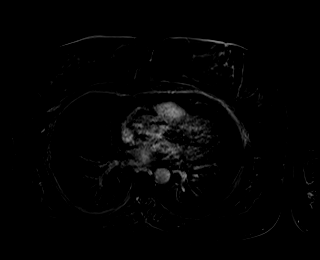

[Series 22: T1 dynamic post-contrast · coronal · 3.0mm · 1.31mm/px · 3 of 72 slices shown]
[im 1/72]
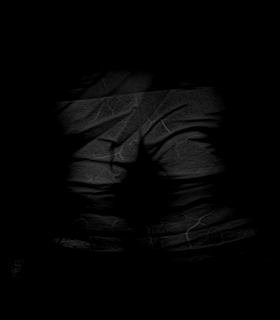
[im 36/72]
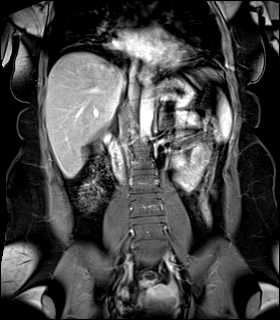
[im 72/72]
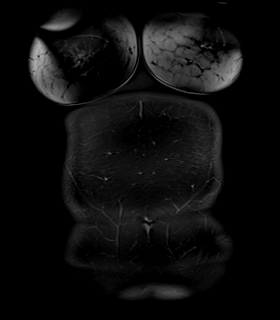

[Series 23: T1 dynamic fat-sat post-contrast · axial · 3.0mm · 1.19mm/px · z∈[-76,+161]mm · 3 of 80 slices shown (4 of 4)]
[im 1/80]
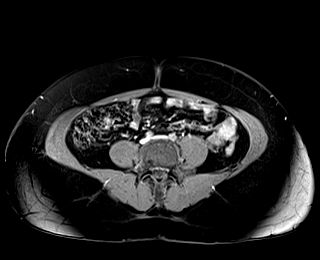
[im 40/80]
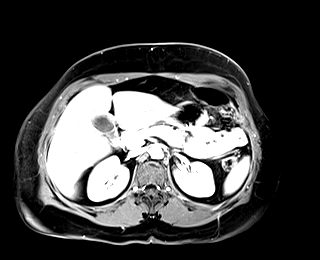
[im 80/80]
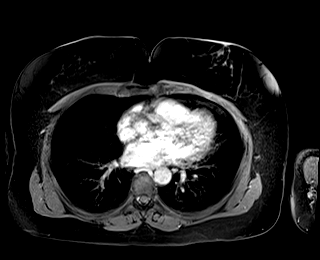

[Series 24: T1 dynamic fat-sat · axial · 3.0mm · 1.19mm/px · z∈[-76,+161]mm · 3 of 80 slices shown (5 of 5)]
[im 1/80]
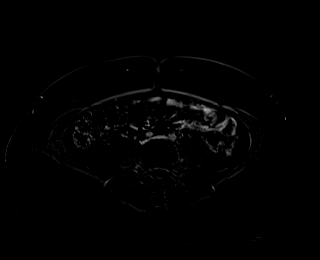
[im 40/80]
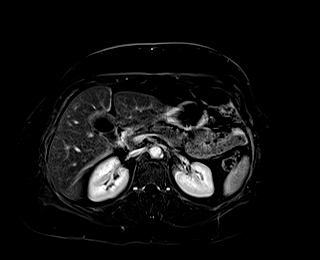
[im 80/80]
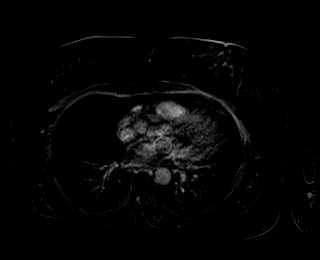

[44 of 48 positions shown; findings below may reference images not displayed]

FINDINGS: Lower chest: No acute abnormality.

Hepatobiliary: Hepatic steatosis. Tiny bilobar hepatic cysts. No
solid enhancing hepatic lesion. Gallbladder is unremarkable. No
biliary ductal dilation.

Pancreas: Stable size of the well-circumscribed fluid intensity
lesion in the tail the pancreas measuring 14 x 10 mm on image [DATE],
unchanged. The lesion does not demonstrate suspicious postcontrast
enhancement or septal/mural thickening. No evidence of ductal
communication. No new pancreatic lesions identified. No pancreatic
ductal dilation.

Spleen:  No splenomegaly or focal splenic lesion.

Adrenals/Urinary Tract: Bilateral adrenal glands appear normal.
Prominence of the right ureter and collecting system without
discrete hydronephrosis. No solid enhancing renal mass.

Stomach/Bowel: Visualized portions within the abdomen are
unremarkable.

Vascular/Lymphatic: No pathologically enlarged lymph nodes
identified. No abdominal aortic aneurysm demonstrated.

Other:  No abdominal free fluid.

Musculoskeletal: No suspicious bone lesions identified.
IMPRESSION: 1. Stable 14 mm cystic lesion in the pancreatic tail, without
suspicious MRI features. Most likely reflecting a small side branch
IPMN. Recommend follow up pre and post contrast MRI/MRCP in 1 year.
This recommendation follows ACR consensus guidelines: Management of
Incidental Pancreatic Cysts: A White Paper of the ACR Incidental
Findings Committee. [HOSPITAL] [7K];[DATE].
2. Prominence of the right ureter and collecting system without
discrete hydronephrosis.
3. Hepatic steatosis.

## 2021-06-06 MED ORDER — GADOBUTROL 1 MMOL/ML IV SOLN
6.0000 mL | Freq: Once | INTRAVENOUS | Status: AC | PRN
  Administered 2021-06-06: 6 mL via INTRAVENOUS

## 2022-05-01 ENCOUNTER — Other Ambulatory Visit: Payer: Self-pay | Admitting: Internal Medicine

## 2022-05-01 DIAGNOSIS — N63 Unspecified lump in unspecified breast: Secondary | ICD-10-CM

## 2022-05-28 ENCOUNTER — Ambulatory Visit
Admission: RE | Admit: 2022-05-28 | Discharge: 2022-05-28 | Disposition: A | Discharge status: Home / Self Care | Payer: BC Managed Care – PPO | Source: Ambulatory Visit | Attending: Internal Medicine | Admitting: Internal Medicine

## 2022-05-28 DIAGNOSIS — N63 Unspecified lump in unspecified breast: Secondary | ICD-10-CM | POA: Diagnosis present

## 2022-05-29 ENCOUNTER — Other Ambulatory Visit: Payer: Self-pay | Admitting: Internal Medicine

## 2022-05-29 DIAGNOSIS — K862 Cyst of pancreas: Secondary | ICD-10-CM

## 2022-06-09 ENCOUNTER — Ambulatory Visit
Admission: RE | Admit: 2022-06-09 | Discharge: 2022-06-09 | Disposition: A | Discharge status: Home / Self Care | Payer: BC Managed Care – PPO | Source: Ambulatory Visit | Attending: Internal Medicine | Admitting: Internal Medicine

## 2022-06-09 DIAGNOSIS — K862 Cyst of pancreas: Secondary | ICD-10-CM | POA: Insufficient documentation

## 2022-06-09 MED ORDER — GADOBUTROL 1 MMOL/ML IV SOLN
7.0000 mL | Freq: Once | INTRAVENOUS | Status: AC | PRN
  Administered 2022-06-09: 7 mL via INTRAVENOUS

## 2023-05-24 ENCOUNTER — Other Ambulatory Visit: Payer: Self-pay | Admitting: Internal Medicine

## 2023-05-24 DIAGNOSIS — Z1231 Encounter for screening mammogram for malignant neoplasm of breast: Secondary | ICD-10-CM

## 2023-06-10 ENCOUNTER — Ambulatory Visit
Admission: RE | Admit: 2023-06-10 | Discharge: 2023-06-10 | Disposition: A | Discharge status: Home / Self Care | Payer: Self-pay | Source: Ambulatory Visit | Attending: Internal Medicine | Admitting: Internal Medicine

## 2023-06-10 DIAGNOSIS — Z1231 Encounter for screening mammogram for malignant neoplasm of breast: Secondary | ICD-10-CM | POA: Insufficient documentation

## 2024-03-02 ENCOUNTER — Other Ambulatory Visit: Payer: Self-pay

## 2024-03-02 DIAGNOSIS — Z87442 Personal history of urinary calculi: Secondary | ICD-10-CM

## 2024-03-02 DIAGNOSIS — R1031 Right lower quadrant pain: Secondary | ICD-10-CM

## 2024-03-10 ENCOUNTER — Ambulatory Visit: Admission: RE | Admit: 2024-03-10 | Discharge: 2024-03-10

## 2024-03-10 DIAGNOSIS — Z87442 Personal history of urinary calculi: Secondary | ICD-10-CM

## 2024-03-10 DIAGNOSIS — R1031 Right lower quadrant pain: Secondary | ICD-10-CM
